# Patient Record
Sex: Female | Born: 1985 | Race: White | Hispanic: No | Marital: Married | Smoking: Former smoker
Health system: Southern US, Community
[De-identification: ages and names within clinical notes are randomized; demographics above are authoritative.]

## PROBLEM LIST (undated history)

## (undated) ENCOUNTER — Inpatient Hospital Stay (HOSPITAL_COMMUNITY): Payer: Self-pay

## (undated) DIAGNOSIS — O139 Gestational [pregnancy-induced] hypertension without significant proteinuria, unspecified trimester: Secondary | ICD-10-CM

## (undated) DIAGNOSIS — F32A Depression, unspecified: Secondary | ICD-10-CM

## (undated) DIAGNOSIS — R519 Headache, unspecified: Secondary | ICD-10-CM

## (undated) DIAGNOSIS — R51 Headache: Secondary | ICD-10-CM

## (undated) DIAGNOSIS — Z789 Other specified health status: Secondary | ICD-10-CM

## (undated) DIAGNOSIS — F329 Major depressive disorder, single episode, unspecified: Secondary | ICD-10-CM

## (undated) HISTORY — PX: NO PAST SURGERIES: SHX2092

---
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
## 2009-05-18 ENCOUNTER — Emergency Department (HOSPITAL_COMMUNITY): Admission: EM | Admit: 2009-05-18 | Discharge: 2009-05-19 | Payer: Self-pay | Admitting: Emergency Medicine

## 2010-03-14 ENCOUNTER — Other Ambulatory Visit (HOSPITAL_COMMUNITY)
Admission: RE | Admit: 2010-03-14 | Discharge: 2010-03-14 | Disposition: A | Discharge status: Home / Self Care | Payer: BC Managed Care – PPO | Source: Ambulatory Visit | Attending: Family Medicine | Admitting: Family Medicine

## 2010-03-14 ENCOUNTER — Other Ambulatory Visit: Payer: Self-pay | Admitting: Family Medicine

## 2010-03-14 DIAGNOSIS — Z124 Encounter for screening for malignant neoplasm of cervix: Secondary | ICD-10-CM | POA: Insufficient documentation

## 2011-10-08 ENCOUNTER — Other Ambulatory Visit: Payer: Self-pay | Admitting: Family Medicine

## 2011-10-08 ENCOUNTER — Other Ambulatory Visit (HOSPITAL_COMMUNITY)
Admission: RE | Admit: 2011-10-08 | Discharge: 2011-10-08 | Disposition: A | Discharge status: Home / Self Care | Payer: BC Managed Care – PPO | Source: Ambulatory Visit | Attending: Family Medicine | Admitting: Family Medicine

## 2011-10-08 DIAGNOSIS — Z124 Encounter for screening for malignant neoplasm of cervix: Secondary | ICD-10-CM | POA: Insufficient documentation

## 2011-10-08 DIAGNOSIS — Z1151 Encounter for screening for human papillomavirus (HPV): Secondary | ICD-10-CM | POA: Insufficient documentation

## 2015-07-31 ENCOUNTER — Other Ambulatory Visit: Payer: Self-pay | Admitting: Obstetrics and Gynecology

## 2015-07-31 LAB — OB RESULTS CONSOLE GC/CHLAMYDIA
CHLAMYDIA, DNA PROBE: NEGATIVE
Gonorrhea: NEGATIVE

## 2015-08-01 LAB — CYTOLOGY - PAP

## 2015-08-14 LAB — OB RESULTS CONSOLE HIV ANTIBODY (ROUTINE TESTING): HIV: NONREACTIVE

## 2015-08-14 LAB — OB RESULTS CONSOLE RUBELLA ANTIBODY, IGM: Rubella: IMMUNE

## 2015-08-14 LAB — OB RESULTS CONSOLE RPR: RPR: NONREACTIVE

## 2015-08-14 LAB — OB RESULTS CONSOLE HEPATITIS B SURFACE ANTIGEN: Hepatitis B Surface Ag: NEGATIVE

## 2015-12-13 ENCOUNTER — Inpatient Hospital Stay (HOSPITAL_COMMUNITY)
Admission: AD | Admit: 2015-12-13 | Discharge: 2015-12-15 | DRG: 781 | Disposition: A | Discharge status: Home / Self Care | Payer: Commercial Managed Care - PPO | Source: Ambulatory Visit | Attending: Obstetrics and Gynecology | Admitting: Obstetrics and Gynecology

## 2015-12-13 ENCOUNTER — Encounter (HOSPITAL_COMMUNITY): Payer: Self-pay

## 2015-12-13 DIAGNOSIS — O1493 Unspecified pre-eclampsia, third trimester: Secondary | ICD-10-CM

## 2015-12-13 DIAGNOSIS — Z8249 Family history of ischemic heart disease and other diseases of the circulatory system: Secondary | ICD-10-CM

## 2015-12-13 DIAGNOSIS — O1490 Unspecified pre-eclampsia, unspecified trimester: Secondary | ICD-10-CM | POA: Diagnosis present

## 2015-12-13 DIAGNOSIS — Z3A29 29 weeks gestation of pregnancy: Secondary | ICD-10-CM | POA: Diagnosis not present

## 2015-12-13 DIAGNOSIS — O1403 Mild to moderate pre-eclampsia, third trimester: Principal | ICD-10-CM | POA: Diagnosis present

## 2015-12-13 DIAGNOSIS — Z87891 Personal history of nicotine dependence: Secondary | ICD-10-CM | POA: Diagnosis not present

## 2015-12-13 DIAGNOSIS — Z888 Allergy status to other drugs, medicaments and biological substances status: Secondary | ICD-10-CM

## 2015-12-13 DIAGNOSIS — Z882 Allergy status to sulfonamides status: Secondary | ICD-10-CM | POA: Diagnosis not present

## 2015-12-13 HISTORY — DX: Major depressive disorder, single episode, unspecified: F32.9

## 2015-12-13 HISTORY — DX: Other specified health status: Z78.9

## 2015-12-13 HISTORY — DX: Gestational (pregnancy-induced) hypertension without significant proteinuria, unspecified trimester: O13.9

## 2015-12-13 HISTORY — DX: Depression, unspecified: F32.A

## 2015-12-13 LAB — URINALYSIS, ROUTINE W REFLEX MICROSCOPIC
BILIRUBIN URINE: NEGATIVE
GLUCOSE, UA: NEGATIVE mg/dL
Ketones, ur: NEGATIVE mg/dL
Nitrite: NEGATIVE
PROTEIN: NEGATIVE mg/dL
pH: 6.5 (ref 5.0–8.0)

## 2015-12-13 LAB — COMPREHENSIVE METABOLIC PANEL
ALK PHOS: 276 U/L — AB (ref 38–126)
ALT: 20 U/L (ref 14–54)
ANION GAP: 7 (ref 5–15)
AST: 22 U/L (ref 15–41)
Albumin: 2.8 g/dL — ABNORMAL LOW (ref 3.5–5.0)
BUN: 17 mg/dL (ref 6–20)
CALCIUM: 9 mg/dL (ref 8.9–10.3)
CO2: 22 mmol/L (ref 22–32)
CREATININE: 0.69 mg/dL (ref 0.44–1.00)
Chloride: 104 mmol/L (ref 101–111)
Glucose, Bld: 97 mg/dL (ref 65–99)
Potassium: 3.9 mmol/L (ref 3.5–5.1)
Sodium: 133 mmol/L — ABNORMAL LOW (ref 135–145)
Total Bilirubin: 0.7 mg/dL (ref 0.3–1.2)
Total Protein: 5.8 g/dL — ABNORMAL LOW (ref 6.5–8.1)

## 2015-12-13 LAB — TYPE AND SCREEN
ABO/RH(D): B POS
Antibody Screen: NEGATIVE

## 2015-12-13 LAB — PROTEIN / CREATININE RATIO, URINE
CREATININE, URINE: 28 mg/dL
PROTEIN CREATININE RATIO: 1.18 mg/mg{creat} — AB (ref 0.00–0.15)
TOTAL PROTEIN, URINE: 33 mg/dL

## 2015-12-13 LAB — URINE MICROSCOPIC-ADD ON

## 2015-12-13 LAB — CBC
HCT: 36 % (ref 36.0–46.0)
Hemoglobin: 12.6 g/dL (ref 12.0–15.0)
MCH: 30.7 pg (ref 26.0–34.0)
MCHC: 35 g/dL (ref 30.0–36.0)
MCV: 87.6 fL (ref 78.0–100.0)
PLATELETS: 207 10*3/uL (ref 150–400)
RBC: 4.11 MIL/uL (ref 3.87–5.11)
RDW: 12.7 % (ref 11.5–15.5)
WBC: 11.7 10*3/uL — AB (ref 4.0–10.5)

## 2015-12-13 LAB — ABO/RH: ABO/RH(D): B POS

## 2015-12-13 MED ORDER — CALCIUM CARBONATE ANTACID 500 MG PO CHEW: 2.0000 | CHEWABLE_TABLET | ORAL | Status: DC | PRN

## 2015-12-13 MED ORDER — HYDRALAZINE HCL 20 MG/ML IJ SOLN: 10.0000 mg | Freq: Once | INTRAMUSCULAR | Status: DC | PRN

## 2015-12-13 MED ORDER — LABETALOL HCL 5 MG/ML IV SOLN
20.0000 mg | INTRAVENOUS | Status: DC | PRN
Start: 2015-12-13 — End: 2015-12-15

## 2015-12-13 MED ORDER — ZOLPIDEM TARTRATE 5 MG PO TABS: 5.0000 mg | ORAL_TABLET | Freq: Every evening | ORAL | Status: DC | PRN

## 2015-12-13 MED ORDER — DOCUSATE SODIUM 100 MG PO CAPS
100.0000 mg | ORAL_CAPSULE | Freq: Every day | ORAL | Status: DC
  Filled 2015-12-13: qty 1

## 2015-12-13 MED ORDER — ACETAMINOPHEN 325 MG PO TABS
650.0000 mg | ORAL_TABLET | ORAL | Status: DC | PRN
  Administered 2015-12-14: 650 mg via ORAL
  Filled 2015-12-13: qty 2

## 2015-12-13 MED ORDER — BETAMETHASONE SOD PHOS & ACET 6 (3-3) MG/ML IJ SUSP
12.0000 mg | INTRAMUSCULAR | Status: AC
  Administered 2015-12-13 – 2015-12-14 (×2): 12 mg via INTRAMUSCULAR
  Filled 2015-12-13 (×2): qty 2

## 2015-12-13 MED ORDER — PRENATAL MULTIVITAMIN CH
1.0000 | ORAL_TABLET | Freq: Every day | ORAL | Status: DC
Start: 2015-12-13 — End: 2015-12-15
  Administered 2015-12-14: 1 via ORAL
  Filled 2015-12-13: qty 1

## 2015-12-13 NOTE — MAU Note (Signed)
Sent from the office for hypertension.

## 2015-12-13 NOTE — MAU Provider Note (Signed)
Chief Complaint:  Hypertension    HPI: Kayla Bryant is a 30 y.o. G1P0 at 63100w2d who presents to maternity admissions reporting elevated BPs[facility]  Patient states this week she has been feeling "crummy" with on/off headaches. She noted to have blurred vision yesterday, but it resolved after 1 hour. She also had a headache yesterday that resolved after rest. She has been taking her BPs at home, which have been elevated, but was told her BP cuff was not accurate when she brought it to the office to compare. In the office for the past couple of weeks she has had normal range BPs, but today when she went in, she had BPs in the 140-150/90-100. She has had edema of LE that has worsened this past week. Denies currently any HAs, changes in vision, RUQ/epigastric pain, CP/SOB.   Denies contractions, leakage of fluid or vaginal bleeding. Good fetal movement.    Past Medical History: No past medical history on file.  Past obstetric history: OB History  Gravida Para Term Preterm AB Living  1            SAB TAB Ectopic Multiple Live Births        0      # Outcome Date GA Lbr Len/2nd Weight Sex Delivery Anes PTL Lv  1 Current               Past Surgical History: No past surgical history on file.   Family History: No family history on file.  Social History: Social History  Substance Use Topics  . Smoking status: Not on file  . Smokeless tobacco: Not on file  . Alcohol use Not on file    Allergies:  Allergies  Allergen Reactions  . Lexapro [Escitalopram] Itching  . Sulfa Antibiotics Itching    Meds:  No prescriptions prior to admission.    I have reviewed patient's Past Medical Hx, Surgical Hx, Family Hx, Social Hx, medications and allergies.   ROS:  A comprehensive ROS was negative except per HPI.    Physical Exam  Patient Vitals for the past 24 hrs:  Weight  12/13/15 0902 227 lb 12.8 oz (103.3 kg)   Constitutional: Well-developed, well-nourished obese  female in no acute distress.  Cardiovascular: normal rate, rhythm, no murmurs Respiratory: normal effort, CTAB GI: Abd soft, non-tender, gravid appropriate for gestational age. Pos BS x 4 MS: Extremities nontender, +1 pitting edema bilateral LE, normal ROM Neurologic: Alert and oriented x 4.  GU: Neg CVAT.    FHT:  Baseline 140, moderate variability, accelerations present, no decelerations Contractions: None   Labs: Results for orders placed or performed during the hospital encounter of 12/13/15 (from the past 24 hour(s))  Urinalysis, Routine w reflex microscopic (not [facility])     Status: Abnormal   Collection Time: 12/13/15  8:57 AM  Result Value Ref Range   Color, Urine YELLOW YELLOW   APPearance HAZY (A) CLEAR   Specific Gravity, Urine <1.005 (L) 1.005 - 1.030   pH 6.5 5.0 - 8.0   Glucose, UA NEGATIVE NEGATIVE mg/dL   Hgb urine dipstick TRACE (A) NEGATIVE   Bilirubin Urine NEGATIVE NEGATIVE   Ketones, ur NEGATIVE NEGATIVE mg/dL   Protein, ur NEGATIVE NEGATIVE mg/dL   Nitrite NEGATIVE NEGATIVE   Leukocytes, UA SMALL (A) NEGATIVE  Protein / creatinine ratio, urine     Status: Abnormal   Collection Time: 12/13/15  8:57 AM  Result Value Ref Range   Creatinine, Urine  28.00 mg/dL   Total Protein, Urine 33 mg/dL   Protein Creatinine Ratio 1.18 (H) 0.00 - 0.15 mg/mg[Cre]  Urine microscopic-add on     Status: Abnormal   Collection Time: 12/13/15  8:57 AM  Result Value Ref Range   Squamous Epithelial / LPF 6-30 (A) NONE SEEN   WBC, UA 6-30 0 - 5 WBC/hpf   RBC / HPF 0-5 0 - 5 RBC/hpf   Bacteria, UA MANY (A) NONE SEEN  CBC     Status: Abnormal   Collection Time: 12/13/15  9:28 AM  Result Value Ref Range   WBC 11.7 (H) 4.0 - 10.5 K/uL   RBC 4.11 3.87 - 5.11 MIL/uL   Hemoglobin 12.6 12.0 - 15.0 g/dL   HCT 16.136.0 09.636.0 - 04.546.0 %   MCV 87.6 78.0 - 100.0 fL   MCH 30.7 26.0 - 34.0 pg   MCHC 35.0 30.0 - 36.0 g/dL   RDW 40.912.7 81.111.5 - 91.415.5 %   Platelets 207 150 - 400 K/uL    Imaging:   No results found.  MAU Course: PreE workup BPs  Spoke with Dr. Henderson CloudHorvath who agrees with preeclampsia, would like the patient admitted for observation and corticosteroids. Magnesium not indicated at this time.  MDM: Plan of care reviewed with patient, including labs and tests ordered and medical treatment.  I personally reviewed the patient's NST today, found to be REACTIVE. 140 bpm, mod var, +accels, no decels. CTX: None   Assessment: Preeclampsia without severe features  Plan: Admit to inpatient for 24 hours observation, Betamethasone, 24 hour urine protein collection    [facility] Woodland Baltasar Twilley, DO 12/13/2015 9:05 AM

## 2015-12-13 NOTE — MAU Provider Note (Signed)
Please refer to full NP note below.  Briefly, this is a 30 y.o. G1P0 2291w2d with probably early mild preeclampsia and headache yesterday, no other severe features.  BPs are now 140s-150s/90-100s and she has a pr-cr ratio of 1.8.  Her headache yesterday did resolve with rest.  All other labs were normal.    FHTs 140s, gSTV, NST R, occ mild variable.  Because of early gestation and early preeclampsia, will admit for obs for 24 hours, do 24 hour urine, BMZ administration, and repeat labs in am tomorrow.    Kayla Bryant A  NP note:  Chief Complaint:  Hypertension    HPI: Kayla Bryant is a 30 y.o. G1P0 at 2791w2d who presents to maternity admissions reporting elevated BPs[facility]  Patient states this week she has been feeling "crummy" with on/off headaches. She noted to have blurred vision yesterday, but it resolved after 1 hour. She also had a headache yesterday that resolved after rest. She has been taking her BPs at home, which have been elevated, but was told her BP cuff was not accurate when she brought it to the office to compare. In the office for the past couple of weeks she has had normal range BPs, but today when she went in, she had BPs in the 140-150/90-100. She has had edema of LE that has worsened this past week. Denies currently any HAs, changes in vision, RUQ/epigastric pain, CP/SOB.   Denies contractions, leakage of fluid or vaginal bleeding. Good fetal movement.    Past Medical History: Past Medical History:  Diagnosis Date  . Depression   . Medical history non-contributory   . Pregnancy induced hypertension     Past obstetric history: OB History  Gravida Para Term Preterm AB Living  1            SAB TAB Ectopic Multiple Live Births        0      # Outcome Date GA Lbr Len/2nd Weight Sex Delivery Anes PTL Lv  1 Current               Past Surgical History: Past Surgical History:  Procedure Laterality Date  . NO PAST SURGERIES       Family  History: Family History  Problem Relation Age of Onset  . Hypertension Brother   . Diabetes Paternal Uncle   . Diabetes Maternal Grandfather   . Cancer Paternal Grandmother     Social History: Social History  Substance Use Topics  . Smoking status: Former Smoker    Quit date: 02/11/2005  . Smokeless tobacco: Never Used  . Alcohol use No    Allergies:  Allergies  Allergen Reactions  . Lexapro [Escitalopram] Itching  . Sulfa Antibiotics Itching    Meds:  Prescriptions Prior to Admission  Medication Sig Dispense Refill Last Dose  . Prenatal Vit-Fe Fumarate-FA (PRENATAL MULTIVITAMIN) TABS tablet Take 1 tablet by mouth daily at 12 noon.   12/11/2015  . ranitidine (ZANTAC) 150 MG capsule Take 150 mg by mouth every evening.   12/12/2015 at Unknown time    I have reviewed patient's Past Medical Hx, Surgical Hx, Family Hx, Social Hx, medications and allergies.   ROS:  A comprehensive ROS was negative except per HPI.    Physical Exam   Patient Vitals for the past 24 hrs:  BP Temp Temp src Pulse Resp SpO2 Height Weight  12/13/15 1135 (!) 140/92 98.5 F (36.9 C) Oral 74 18 - 5\' 4"  (1.626  m) 103.3 kg (227 lb 12.8 oz)  12/13/15 1053 136/83 - - 67 - - - -  12/13/15 1050 - - - 80 - 97 % - -  12/13/15 1042 - - - 77 - 99 % - -  12/13/15 1037 - - - 66 - 96 % - -  12/13/15 1036 115/83 - - 75 - - - -  12/13/15 1032 - - - 81 - 96 % - -  12/13/15 1027 - - - 76 - 96 % - -  12/13/15 1020 134/84 - - - - - - -  12/13/15 0936 146/85 - - 86 - - - -  12/13/15 0920 147/93 - - 84 - - - -  12/13/15 0914 144/96 - - 80 - - - -  12/13/15 0910 154/94 98.4 F (36.9 C) Oral 80 16 - - -  12/13/15 1610 - - - - - - 5\' 4"  (1.626 m) -  12/13/15 0902 - - - - - - - 103.3 kg (227 lb 12.8 oz)   Constitutional: Well-developed, well-nourished obese female in no acute distress.  Cardiovascular: normal rate, rhythm, no murmurs Respiratory: normal effort, CTAB GI: Abd soft, non-tender, gravid appropriate for  gestational age. Pos BS x 4 MS: Extremities nontender, +1 pitting edema bilateral LE, normal ROM Neurologic: Alert and oriented x 4.  GU: Neg CVAT.    FHT:  Baseline 140, moderate variability, accelerations present, no decelerations Contractions: None   Labs: Results for orders placed or performed during the hospital encounter of 12/13/15 (from the past 24 hour(s))  Urinalysis, Routine w reflex microscopic (not at [facility])     Status: Abnormal   Collection Time: 12/13/15  8:57 AM  Result Value Ref Range   Color, Urine YELLOW YELLOW   APPearance HAZY (A) CLEAR   Specific Gravity, Urine <1.005 (L) 1.005 - 1.030   pH 6.5 5.0 - 8.0   Glucose, UA NEGATIVE NEGATIVE mg/dL   Hgb urine dipstick TRACE (A) NEGATIVE   Bilirubin Urine NEGATIVE NEGATIVE   Ketones, ur NEGATIVE NEGATIVE mg/dL   Protein, ur NEGATIVE NEGATIVE mg/dL   Nitrite NEGATIVE NEGATIVE   Leukocytes, UA SMALL (A) NEGATIVE  Protein / creatinine ratio, urine     Status: Abnormal   Collection Time: 12/13/15  8:57 AM  Result Value Ref Range   Creatinine, Urine 28.00 mg/dL   Total Protein, Urine 33 mg/dL   Protein Creatinine Ratio 1.18 (H) 0.00 - 0.15 mg/mg[Cre]  Urine microscopic-add on     Status: Abnormal   Collection Time: 12/13/15  8:57 AM  Result Value Ref Range   Squamous Epithelial / LPF 6-30 (A) NONE SEEN   WBC, UA 6-30 0 - 5 WBC/hpf   RBC / HPF 0-5 0 - 5 RBC/hpf   Bacteria, UA MANY (A) NONE SEEN  Comprehensive metabolic panel     Status: Abnormal   Collection Time: 12/13/15  9:28 AM  Result Value Ref Range   Sodium 133 (L) 135 - 145 mmol/L   Potassium 3.9 3.5 - 5.1 mmol/L   Chloride 104 101 - 111 mmol/L   CO2 22 22 - 32 mmol/L   Glucose, Bld 97 65 - 99 mg/dL   BUN 17 6 - 20 mg/dL   Creatinine, Ser 9.60 0.44 - 1.00 mg/dL   Calcium 9.0 8.9 - 45.4 mg/dL   Total Protein 5.8 (L) 6.5 - 8.1 g/dL   Albumin 2.8 (L) 3.5 - 5.0 g/dL   AST 22 15 - 41 U/L  ALT 20 14 - 54 U/L   Alkaline Phosphatase 276 (H) 38 - 126 U/L    Total Bilirubin 0.7 0.3 - 1.2 mg/dL   GFR calc non Af Amer >60 >60 mL/min   GFR calc Af Amer >60 >60 mL/min   Anion gap 7 5 - 15  CBC     Status: Abnormal   Collection Time: 12/13/15  9:28 AM  Result Value Ref Range   WBC 11.7 (H) 4.0 - 10.5 K/uL   RBC 4.11 3.87 - 5.11 MIL/uL   Hemoglobin 12.6 12.0 - 15.0 g/dL   HCT 16.136.0 09.636.0 - 04.546.0 %   MCV 87.6 78.0 - 100.0 fL   MCH 30.7 26.0 - 34.0 pg   MCHC 35.0 30.0 - 36.0 g/dL   RDW 40.912.7 81.111.5 - 91.415.5 %   Platelets 207 150 - 400 K/uL  Type and screen [facility]     Status: None   Collection Time: 12/13/15  9:28 AM  Result Value Ref Range   ABO/RH(D) B POS    Antibody Screen NEG    Sample Expiration 12/16/2015     Imaging:  No results found.  MAU Course: PreE workup BPs  Spoke with Dr. Henderson CloudHorvath who agrees with preeclampsia, would like the patient admitted for observation and corticosteroids. Magnesium not indicated at this time.  MDM: Plan of care reviewed with patient, including labs and tests ordered and medical treatment.  I personally reviewed the patient's NST today, found to be REACTIVE. 140 bpm, mod var, +accels, no decels. CTX: None   Assessment: Preeclampsia without severe features  Plan: Admit to inpatient for 24 hours observation, Betamethasone, 24 hour urine protein collection    Carrington ClampMichelle Shawne Bulow, MD 12/13/2015 2:41 PM

## 2015-12-14 LAB — PROTEIN, URINE, 24 HOUR
Collection Interval-UPROT: 24 hours
PROTEIN, 24H URINE: 2940 mg/d — AB (ref 50–100)
PROTEIN, URINE: 147 mg/dL
Urine Total Volume-UPROT: 2000 mL

## 2015-12-14 LAB — CBC
HEMATOCRIT: 37.8 % (ref 36.0–46.0)
Hemoglobin: 13.2 g/dL (ref 12.0–15.0)
MCH: 30.8 pg (ref 26.0–34.0)
MCHC: 34.9 g/dL (ref 30.0–36.0)
MCV: 88.1 fL (ref 78.0–100.0)
Platelets: 259 10*3/uL (ref 150–400)
RBC: 4.29 MIL/uL (ref 3.87–5.11)
RDW: 12.9 % (ref 11.5–15.5)
WBC: 22.1 10*3/uL — AB (ref 4.0–10.5)

## 2015-12-14 LAB — COMPREHENSIVE METABOLIC PANEL
ALT: 22 U/L (ref 14–54)
AST: 24 U/L (ref 15–41)
Albumin: 3.1 g/dL — ABNORMAL LOW (ref 3.5–5.0)
Alkaline Phosphatase: 301 U/L — ABNORMAL HIGH (ref 38–126)
Anion gap: 9 (ref 5–15)
BUN: 17 mg/dL (ref 6–20)
CHLORIDE: 105 mmol/L (ref 101–111)
CO2: 20 mmol/L — ABNORMAL LOW (ref 22–32)
Calcium: 9.1 mg/dL (ref 8.9–10.3)
Creatinine, Ser: 0.67 mg/dL (ref 0.44–1.00)
Glucose, Bld: 107 mg/dL — ABNORMAL HIGH (ref 65–99)
POTASSIUM: 4.3 mmol/L (ref 3.5–5.1)
Sodium: 134 mmol/L — ABNORMAL LOW (ref 135–145)
Total Bilirubin: 0.8 mg/dL (ref 0.3–1.2)
Total Protein: 7.1 g/dL (ref 6.5–8.1)

## 2015-12-14 MED ORDER — CEFUROXIME AXETIL 250 MG PO TABS
250.0000 mg | ORAL_TABLET | Freq: Two times a day (BID) | ORAL | Status: DC
  Administered 2015-12-15: 250 mg via ORAL
  Filled 2015-12-14 (×3): qty 1

## 2015-12-14 NOTE — Discharge Summary (Signed)
Physician Discharge Summary  Patient ID: Leonides GrillsStephanie L Schriever MRN: [id number] DOB/AGE: [birth year] 30 y.o.  Admit date: 12/13/2015 Discharge date: 12/14/2015  Admission Diagnoses:preterm mild preeclampsia  Discharge Diagnoses:  Active Problems:   Preeclampsia   Discharged Condition: good  Hospital Course: Uncomplicated obs for severe symptoms- none seen.  Pt received 2 doses of BMZ and 24 hour urine done.  PIH labs were normal.   Consults: None  Significant Diagnostic Studies: labs:  Results for orders placed or performed during the hospital encounter of 12/13/15 (from the past 24 hour(s))  Culture, beta strep (group b only)     Status: None (Preliminary result)   Collection Time: 12/13/15 11:15 AM  Result Value Ref Range   Specimen Description VAGINAL/RECTAL    Special Requests NONE    Culture      CULTURE REINCUBATED FOR BETTER GROWTH [facility]    Report Status PENDING   Comprehensive metabolic panel     Status: Abnormal   Collection Time: 12/14/15  6:33 AM  Result Value Ref Range   Sodium 134 (L) 135 - 145 mmol/L   Potassium 4.3 3.5 - 5.1 mmol/L   Chloride 105 101 - 111 mmol/L   CO2 20 (L) 22 - 32 mmol/L   Glucose, Bld 107 (H) 65 - 99 mg/dL   BUN 17 6 - 20 mg/dL   Creatinine, Ser 4.090.67 0.44 - 1.00 mg/dL   Calcium 9.1 8.9 - 81.110.3 mg/dL   Total Protein 7.1 6.5 - 8.1 g/dL   Albumin 3.1 (L) 3.5 - 5.0 g/dL   AST 24 15 - 41 U/L   ALT 22 14 - 54 U/L   Alkaline Phosphatase 301 (H) 38 - 126 U/L   Total Bilirubin 0.8 0.3 - 1.2 mg/dL   GFR calc non Af Amer >60 >60 mL/min   GFR calc Af Amer >60 >60 mL/min   Anion gap 9 5 - 15  CBC     Status: Abnormal   Collection Time: 12/14/15  6:33 AM  Result Value Ref Range   WBC 22.1 (H) 4.0 - 10.5 K/uL   RBC 4.29 3.87 - 5.11 MIL/uL   Hemoglobin 13.2 12.0 - 15.0 g/dL   HCT 91.437.8 78.236.0 - 95.646.0 %   MCV 88.1 78.0 - 100.0 fL   MCH 30.8 26.0 - 34.0 pg   MCHC 34.9 30.0 - 36.0 g/dL   RDW 21.312.9 08.611.5 - 57.815.5 %   Platelets  259 150 - 400 K/uL    Treatments: BMZ x 2  Discharge Exam: Blood pressure (!) 144/88, pulse 89, temperature 98.5 F (36.9 C), temperature source Oral, resp. rate 16, height 5\' 4"  (1.626 m), weight 103.3 kg (227 lb 12.8 oz), SpO2 99 %, unknown if currently breastfeeding.   Disposition: Final discharge disposition not confirmed  Discharge Instructions    Discharge activity:  Up to eat    Complete by:  As directed    Discharge diet:  No restrictions    Complete by:  As directed    Discharge instructions    Complete by:  As directed    Modified bedrest.  Refrain from intercourse.  Count baby's movements in 1 hour per day- if you don't get 6 in that hour, call.   Do not have sex or do anything that might make you have an orgasm    Complete by:  As directed    Fetal Kick Count:  Lie on our left side for one hour after a meal, and count  the number of times your baby kicks.  If it is less than 5 times, get up, move around and drink some juice.  Repeat the test 30 minutes later.  If it is still less than 5 kicks in an hour, notify your doctor.    Complete by:  As directed    LABOR:  When conractions begin, you should start to time them from the beginning of one contraction to the beginning  of the next.  When contractions are 5 - 10 minutes apart or less and have been regular for at least an hour, you should call your health care provider.    Complete by:  As directed    Notify physician for bleeding from the vagina    Complete by:  As directed    Notify physician for blurring of vision or spots before the eyes    Complete by:  As directed    Notify physician for chills or fever    Complete by:  As directed    Notify physician for fainting spells, "black outs" or loss of consciousness    Complete by:  As directed    Notify physician for increase in vaginal discharge    Complete by:  As directed    Notify physician for leaking of fluid    Complete by:  As directed    Notify physician for  pain or burning when urinating    Complete by:  As directed    Notify physician for pelvic pressure (sudden increase)    Complete by:  As directed    Notify physician for severe or continued nausea or vomiting    Complete by:  As directed    Notify physician for sudden gushing of fluid from the vagina (with or without continued leaking)    Complete by:  As directed    Notify physician for sudden, constant, or occasional abdominal pain    Complete by:  As directed    Notify physician if baby moving less than usual    Complete by:  As directed        Medication List    TAKE these medications   prenatal multivitamin Tabs tablet Take 1 tablet by mouth daily at 12 noon.   ranitidine 150 MG capsule Commonly known as:  ZANTAC Take 150 mg by mouth every evening.        Signed: Raider Valbuena A 12/14/2015, 10:56 AM

## 2015-12-14 NOTE — Progress Notes (Addendum)
30 y.o. G1P0 8715w3d HD#1 admitted for 29WKS,BP.  Pt now with diagnosis of preeclampsia with proteinuria by PR-CR clearance.  Pt stable over 24 hour obs.  HA this am was resolved quickly with tylenol.   Pt currently stable with no c/o severe preeclampsia symptoms.  Good FM.  Vitals:   12/13/15 2204 12/14/15 0614 12/14/15 0621 12/14/15 0736  BP:  (!) 164/97 (!) 148/94 (!) 144/88  Pulse:  88 91 89  Resp: 18 18 18 16   Temp:  98.5 F (36.9 C)  98.5 F (36.9 C)  TempSrc:  Oral  Oral  SpO2:      Weight:      Height:        Lungs CTA Cor RRR Abd  Soft, gravid, nontender Ex SCDs, +1 edema FHTs  140ss, good short term variability, NST R Toco  occ  Results for orders placed or performed during the hospital encounter of 12/13/15 (from the past 24 hour(s))  Culture, beta strep (group b only)     Status: None (Preliminary result)   Collection Time: 12/13/15 11:15 AM  Result Value Ref Range   Specimen Description VAGINAL/RECTAL    Special Requests NONE    Culture      CULTURE REINCUBATED FOR BETTER GROWTH [facility]formed [facility]    Report Status PENDING   Comprehensive metabolic panel     Status: Abnormal   Collection Time: 12/14/15  6:33 AM  Result Value Ref Range   Sodium 134 (L) 135 - 145 mmol/L   Potassium 4.3 3.5 - 5.1 mmol/L   Chloride 105 101 - 111 mmol/L   CO2 20 (L) 22 - 32 mmol/L   Glucose, Bld 107 (H) 65 - 99 mg/dL   BUN 17 6 - 20 mg/dL   Creatinine, Ser 0.860.67 0.44 - 1.00 mg/dL   Calcium 9.1 8.9 - 57.810.3 mg/dL   Total Protein 7.1 6.5 - 8.1 g/dL   Albumin 3.1 (L) 3.5 - 5.0 g/dL   AST 24 15 - 41 U/L   ALT 22 14 - 54 U/L   Alkaline Phosphatase 301 (H) 38 - 126 U/L   Total Bilirubin 0.8 0.3 - 1.2 mg/dL   GFR calc non Af Amer >60 >60 mL/min   GFR calc Af Amer >60 >60 mL/min   Anion gap 9 5 - 15  CBC     Status: Abnormal   Collection Time: 12/14/15  6:33 AM  Result Value Ref Range   WBC 22.1 (H) 4.0 - 10.5 K/uL   RBC 4.29 3.87 - 5.11 MIL/uL   Hemoglobin 13.2  12.0 - 15.0 g/dL   HCT 46.937.8 62.936.0 - 52.846.0 %   MCV 88.1 78.0 - 100.0 fL   MCH 30.8 26.0 - 34.0 pg   MCHC 34.9 30.0 - 36.0 g/dL   RDW 41.312.9 24.411.5 - 01.015.5 %   Platelets 259 150 - 400 K/uL    A:  HD#1  7215w3d with early mild preeclampsia.  BPs occasionally near severe level but otherwise labs are normal and pt is without symptoms.  P: Continue obs until 24 hours and 24 hour urine is up; will watch BPS and consider keeping if BPs are hitting close to severe range.  Pt is due for second dose of BMZ later.  Pt given precautions if goes home; pt to be seen biweekly at office for testing once a week and BP check other part of week.    Antonela Freiman A

## 2015-12-14 NOTE — Progress Notes (Signed)
24 hour urine came back 2.94 grams of protein.  BPs still occ hitting severe range.    Pt moved to full admission.  Will treat UTI (6-30 WBC on UA), get MFM US for growth and have MFM consult on pt.

## 2015-12-15 ENCOUNTER — Inpatient Hospital Stay (HOSPITAL_COMMUNITY): Payer: Commercial Managed Care - PPO

## 2015-12-15 LAB — CULTURE, BETA STREP (GROUP B ONLY)

## 2015-12-15 NOTE — Progress Notes (Signed)
Discharge instructions reviewed with patient. Verbalized an understanding of discharge instructions. Discharged ambulatory.

## 2015-12-15 NOTE — Progress Notes (Signed)
Name: Leonides GrillsStephanie L Centrella Medical Record Number:  [id number] Date of Birth: October 21, 1985 Date of Service: 12/15/2015  30 y.o. G1P0 5821w4d HD#2 admitted for elevated BPs and concern for preeclampsia with severe features.   Pt currently stable with No complaints. She denies HA, no blurry vision, no scotomata, no RUQ pain.  She denies contractions, no vaginal bleeding, no leaking of fluid. Reports good FM.  The patient's past medical history and prenatal records were reviewed.  Additional issues addressed and updated today: Patient Active Problem List   Diagnosis Date Noted  . Preeclampsia 12/13/2015    Physical Examination:   Vitals:   12/14/15 2345 12/15/15 0805  BP: (!) 146/95 (!) 144/87  Pulse: 78 85  Resp: 18 16  Temp: 98.3 F (36.8 C) 98 F (36.7 C)   General appearance - alert, well appearing, and in no distress and oriented to person, place, and time Mental status - alert, oriented to person, place, and time, normal mood, behavior, speech, dress, motor activity, and thought processes  Abd  Soft, gravid, nontender Ex SCDs FHTs  145s, moderate variability accels no decels Toco  none NEURO: 2+ DTRs Cervix: not evaluated  No results found for this or any previous visit (from the past 24 hour(s)).  A:  HD#2  321w4d with preeclampsia w/o severe features. Steroid complete  P: Discussed case with Dr. Marjo Bickerenney, MFM: -Patient can be discharged today with close outpatient follow up -Growth scan today then q3 weeks -Weekly labs -Weekly Antepartum fetal testing including AFI then once 32 weeks twice weekly NSTs -Delivery once 36-37 weeks.   Mitul Hallowell STACIA

## 2015-12-15 NOTE — Discharge Instructions (Signed)
Hypertension During Pregnancy °Hypertension is also called high blood pressure. Blood pressure moves blood in your body. Sometimes, the force that moves the blood becomes too strong. When you are pregnant, this condition should be watched carefully. It can cause problems for you and your baby. °HOME CARE  °· Make and keep all of your doctor visits. °· Take medicine as told by your doctor. Tell your doctor about all medicines you take. °· Eat very little salt. °· Exercise regularly. °· Do not drink alcohol. °· Do not smoke. °· Do not have drinks with caffeine. °· Lie on your left side when resting. °· Your health care provider may ask you to take one low-dose aspirin (81mg) each day. °GET HELP RIGHT AWAY IF: °· You have bad belly (abdominal) pain. °· You have sudden puffiness (swelling) in the hands, ankles, or face. °· You gain 4 pounds (1.8 kilograms) or more in 1 week. °· You throw up (vomit) repeatedly. °· You have bleeding from the vagina. °· You do not feel the baby moving as much. °· You have a headache. °· You have blurred or double vision. °· You have muscle twitching or spasms. °· You have shortness of breath. °· You have blue fingernails and lips. °· You have blood in your pee (urine). °MAKE SURE YOU: °· Understand these instructions. °· Will watch your condition. °· Will get help right away if you are not doing well or get worse. °  °This information is not intended to replace advice given to you by your health care provider. Make sure you discuss any questions you have with your health care provider. °  °Document Released: 02/28/2010 Document Revised: 02/16/2014 Document Reviewed: 08/25/2012 °Elsevier Interactive Patient Education ©2016 [employer] ° °

## 2015-12-17 NOTE — H&P (Signed)
[name], MD  Obstetrics and Gynecology  Expand All Collapse All   [] Hide copied text Please refer to full NP note below.  Briefly, this is a 30 y.o. G1P0 5856w2d with probably early mild preeclampsia and headache yesterday, no other severe features.  BPs are now 140s-150s/90-100s and she has a pr-cr ratio of 1.8.  Her headache yesterday did resolve with rest.  All other labs were normal.    FHTs 140s, gSTV, NST R, occ mild variable.  Because of early gestation and early preeclampsia, will admit for obs for 24 hours, do 24 hour urine, BMZ administration, and repeat labs in am tomorrow.    Darlisa Spruiell A  NP note:  Chief Complaint:  Hypertension    HPI: Leonides GrillsStephanie L Bryant is a 30 y.o. G1P0 at 7056w2d who presents to maternity admissions reporting elevated BPs, [facility]  Patient states this week she has been feeling "crummy" with on/off headaches. She noted to have blurred vision yesterday, but it resolved after 1 hour. She also had a headache yesterday that resolved after rest. She has been taking her BPs at home, which have been elevated, but was told her BP cuff was not accurate when she brought it to the office to compare. In the office for the past couple of weeks she has had normal range BPs, but today when she went in, she had BPs in the 140-150/90-100. She has had edema of LE that has worsened this past week. Denies currently any HAs, changes in vision, RUQ/epigastric pain, CP/SOB.   Denies contractions, leakage of fluid or vaginal bleeding. Good fetal movement.    Past Medical History:     Past Medical History:  Diagnosis Date  . Depression   . Medical history non-contributory   . Pregnancy induced hypertension     Past obstetric history:                 OB History  Gravida Para Term Preterm AB Living  1            SAB TAB Ectopic Multiple Live Births        0      # Outcome Date GA Lbr Len/2nd Weight Sex Delivery Anes PTL Lv  1  Current               Past Surgical History:      Past Surgical History:  Procedure Laterality Date  . NO PAST SURGERIES       Family History:      Family History  Problem Relation Age of Onset  . Hypertension Brother   . Diabetes Paternal Uncle   . Diabetes Maternal Grandfather   . Cancer Paternal Grandmother     Social History:      Social History  Substance Use Topics  . Smoking status: Former Smoker    Quit date: 02/11/2005  . Smokeless tobacco: Never Used  . Alcohol use No    Allergies:      Allergies  Allergen Reactions  . Lexapro [Escitalopram] Itching  . Sulfa Antibiotics Itching    Meds:         Prescriptions Prior to Admission  Medication Sig Dispense Refill Last Dose  . Prenatal Vit-Fe Fumarate-FA (PRENATAL MULTIVITAMIN) TABS tablet Take 1 tablet by mouth daily at 12 noon.   12/11/2015  . ranitidine (ZANTAC) 150 MG capsule Take 150 mg by mouth every evening.   12/12/2015 at Unknown time    I have reviewed patient's Past  Medical Hx, Surgical Hx, Family Hx, Social Hx, medications and allergies.   ROS:  A comprehensive ROS was negative except per HPI.    Physical Exam   Patient Vitals for the past 24 hrs:  BP Temp Temp src Pulse Resp SpO2 Height Weight  12/13/15 1135 (!) 140/92 98.5 F (36.9 C) Oral 74 18 - 5\' 4"  (1.626 m) 103.3 kg (227 lb 12.8 oz)  12/13/15 1053 136/83 - - 67 - - - -  12/13/15 1050 - - - 80 - 97 % - -  12/13/15 1042 - - - 77 - 99 % - -  12/13/15 1037 - - - 66 - 96 % - -  12/13/15 1036 115/83 - - 75 - - - -  12/13/15 1032 - - - 81 - 96 % - -  12/13/15 1027 - - - 76 - 96 % - -  12/13/15 1020 134/84 - - - - - - -  12/13/15 0936 146/85 - - 86 - - - -  12/13/15 0920 147/93 - - 84 - - - -  12/13/15 0914 144/96 - - 80 - - - -  12/13/15 0910 154/94 98.4 F (36.9 C) Oral 80 16 - - -  12/13/15 [id number] - - - - - - 5\' 4"  (1.626 m) -  12/13/15 0902 - - - - - - - 103.3 kg (227 lb 12.8 oz)    Constitutional: Well-developed, well-nourished obese female in no acute distress.  Cardiovascular: normal rate, rhythm, no murmurs Respiratory: normal effort, CTAB GI: Abd soft, non-tender, gravid appropriate for gestational age. Pos BS x 4 MS: Extremities nontender, +1 pitting edema bilateral LE, normal ROM Neurologic: Alert and oriented x 4.  GU: Neg CVAT.               FHT:  Baseline 140, moderate variability, accelerations present, no decelerations Contractions: None   Labs: LabResultsLast24Hours       Results for orders placed or performed during the hospital encounter of 12/13/15 (from the past 24 hour(s))  Urinalysis, Routine w reflex microscopic ([facility])     Status: Abnormal   Collection Time: 12/13/15  8:57 AM  Result Value Ref Range   Color, Urine YELLOW YELLOW   APPearance HAZY (A) CLEAR   Specific Gravity, Urine <1.005 (L) 1.005 - 1.030   pH 6.5 5.0 - 8.0   Glucose, UA NEGATIVE NEGATIVE mg/dL   Hgb urine dipstick TRACE (A) NEGATIVE   Bilirubin Urine NEGATIVE NEGATIVE   Ketones, ur NEGATIVE NEGATIVE mg/dL   Protein, ur NEGATIVE NEGATIVE mg/dL   Nitrite NEGATIVE NEGATIVE   Leukocytes, UA SMALL (A) NEGATIVE  Protein / creatinine ratio, urine     Status: Abnormal   Collection Time: 12/13/15  8:57 AM  Result Value Ref Range   Creatinine, Urine 28.00 mg/dL   Total Protein, Urine 33 mg/dL   Protein Creatinine Ratio 1.18 (H) 0.00 - 0.15 mg/mg[Cre]  Urine microscopic-add on     Status: Abnormal   Collection Time: 12/13/15  8:57 AM  Result Value Ref Range   Squamous Epithelial / LPF 6-30 (A) NONE SEEN   WBC, UA 6-30 0 - 5 WBC/hpf   RBC / HPF 0-5 0 - 5 RBC/hpf   Bacteria, UA MANY (A) NONE SEEN  Comprehensive metabolic panel     Status: Abnormal   Collection Time: 12/13/15  9:28 AM  Result Value Ref Range   Sodium 133 (L) 135 - 145 mmol/L   Potassium 3.9 3.5 -  5.1 mmol/L   Chloride 104 101 - 111 mmol/L   CO2 22 22 - 32 mmol/L    Glucose, Bld 97 65 - 99 mg/dL   BUN 17 6 - 20 mg/dL   Creatinine, Ser 1.61 0.44 - 1.00 mg/dL   Calcium 9.0 8.9 - 09.6 mg/dL   Total Protein 5.8 (L) 6.5 - 8.1 g/dL   Albumin 2.8 (L) 3.5 - 5.0 g/dL   AST 22 15 - 41 U/L   ALT 20 14 - 54 U/L   Alkaline Phosphatase 276 (H) 38 - 126 U/L   Total Bilirubin 0.7 0.3 - 1.2 mg/dL   GFR calc non Af Amer >60 >60 mL/min   GFR calc Af Amer >60 >60 mL/min   Anion gap 7 5 - 15  CBC     Status: Abnormal   Collection Time: 12/13/15  9:28 AM  Result Value Ref Range   WBC 11.7 (H) 4.0 - 10.5 K/uL   RBC 4.11 3.87 - 5.11 MIL/uL   Hemoglobin 12.6 12.0 - 15.0 g/dL   HCT 04.5 40.9 - 81.1 %   MCV 87.6 78.0 - 100.0 fL   MCH 30.7 26.0 - 34.0 pg   MCHC 35.0 30.0 - 36.0 g/dL   RDW 91.4 78.2 - 95.6 %   Platelets 207 150 - 400 K/uL  Type and screen [facility] [place] [facility]     Status: None   Collection Time: 12/13/15  9:28 AM  Result Value Ref Range   ABO/RH(D) B POS    Antibody Screen NEG    Sample Expiration 12/16/2015       Imaging:  ImagingResults  No results found.    MAU Course: PreE workup BPs  Spoke with Dr. Henderson Cloud who agrees with preeclampsia, would like the patient admitted for observation and corticosteroids. Magnesium not indicated at this time.  MDM: Plan of care reviewed with patient, including labs and tests ordered and medical treatment.  I personally reviewed the patient's NST today, found to be REACTIVE. 140 bpm, mod var, +accels, no decels. CTX: None   Assessment: Preeclampsia without severe features  Plan: Admit to inpatient for 24 hours observation, Betamethasone, 24 hour urine protein collection    Carrington Clamp, MD 12/13/2015 2:41 PM      Electronically signed by Carrington Clamp, MD at 12/13/2015 2:50 PM      Admission (Discharged) on 12/13/2015        Detailed Report

## 2015-12-25 ENCOUNTER — Other Ambulatory Visit: Payer: Self-pay | Admitting: Obstetrics and Gynecology

## 2015-12-31 ENCOUNTER — Encounter (HOSPITAL_COMMUNITY): Payer: Self-pay | Admitting: *Deleted

## 2015-12-31 ENCOUNTER — Inpatient Hospital Stay (HOSPITAL_COMMUNITY)
Admission: AD | Admit: 2015-12-31 | Discharge: 2015-12-31 | Disposition: A | Discharge status: Home / Self Care | Payer: Commercial Managed Care - PPO | Source: Ambulatory Visit | Attending: Obstetrics | Admitting: Obstetrics

## 2015-12-31 DIAGNOSIS — I10 Essential (primary) hypertension: Secondary | ICD-10-CM | POA: Diagnosis not present

## 2015-12-31 DIAGNOSIS — Z3A31 31 weeks gestation of pregnancy: Secondary | ICD-10-CM | POA: Insufficient documentation

## 2015-12-31 DIAGNOSIS — O163 Unspecified maternal hypertension, third trimester: Secondary | ICD-10-CM | POA: Insufficient documentation

## 2015-12-31 DIAGNOSIS — O1493 Unspecified pre-eclampsia, third trimester: Secondary | ICD-10-CM | POA: Diagnosis not present

## 2015-12-31 DIAGNOSIS — F329 Major depressive disorder, single episode, unspecified: Secondary | ICD-10-CM | POA: Insufficient documentation

## 2015-12-31 DIAGNOSIS — Z87891 Personal history of nicotine dependence: Secondary | ICD-10-CM | POA: Diagnosis not present

## 2015-12-31 DIAGNOSIS — O99343 Other mental disorders complicating pregnancy, third trimester: Secondary | ICD-10-CM | POA: Diagnosis not present

## 2015-12-31 DIAGNOSIS — R030 Elevated blood-pressure reading, without diagnosis of hypertension: Secondary | ICD-10-CM | POA: Diagnosis present

## 2015-12-31 DIAGNOSIS — Z3689 Encounter for other specified antenatal screening: Secondary | ICD-10-CM

## 2015-12-31 HISTORY — DX: Headache, unspecified: R51.9

## 2015-12-31 HISTORY — DX: Headache: R51

## 2015-12-31 LAB — PROTEIN / CREATININE RATIO, URINE
Creatinine, Urine: 37 mg/dL
Protein Creatinine Ratio: 5.78 mg/mg{Cre} — ABNORMAL HIGH (ref 0.00–0.15)
Total Protein, Urine: 214 mg/dL

## 2015-12-31 LAB — COMPREHENSIVE METABOLIC PANEL
ALBUMIN: 2.4 g/dL — AB (ref 3.5–5.0)
ALK PHOS: 429 U/L — AB (ref 38–126)
ALT: 15 U/L (ref 14–54)
ANION GAP: 9 (ref 5–15)
AST: 42 U/L — ABNORMAL HIGH (ref 15–41)
BILIRUBIN TOTAL: 1.2 mg/dL (ref 0.3–1.2)
BUN: 17 mg/dL (ref 6–20)
CALCIUM: 8.5 mg/dL — AB (ref 8.9–10.3)
CO2: 20 mmol/L — ABNORMAL LOW (ref 22–32)
CREATININE: 0.88 mg/dL (ref 0.44–1.00)
Chloride: 104 mmol/L (ref 101–111)
GFR calc non Af Amer: >60 mL/min (ref >60)
GLUCOSE: 75 mg/dL (ref 65–99)
Potassium: 5.5 mmol/L — ABNORMAL HIGH (ref 3.5–5.1)
Sodium: 133 mmol/L — ABNORMAL LOW (ref 135–145)
TOTAL PROTEIN: 5.4 g/dL — AB (ref 6.5–8.1)

## 2015-12-31 LAB — CBC
HEMATOCRIT: 39.2 % (ref 36.0–46.0)
HEMOGLOBIN: 13.7 g/dL (ref 12.0–15.0)
MCH: 31.1 pg (ref 26.0–34.0)
MCHC: 34.9 g/dL (ref 30.0–36.0)
MCV: 88.9 fL (ref 78.0–100.0)
Platelets: 212 10*3/uL (ref 150–400)
RBC: 4.41 MIL/uL (ref 3.87–5.11)
RDW: 13.3 % (ref 11.5–15.5)
WBC: 12.2 10*3/uL — ABNORMAL HIGH (ref 4.0–10.5)

## 2015-12-31 MED ORDER — LABETALOL HCL 5 MG/ML IV SOLN
20.0000 mg | INTRAVENOUS | Status: DC | PRN
  Administered 2015-12-31: 20 mg via INTRAVENOUS
  Filled 2015-12-31: qty 4

## 2015-12-31 MED ORDER — HYDRALAZINE HCL 20 MG/ML IJ SOLN: 10.0000 mg | Freq: Once | INTRAMUSCULAR | Status: DC | PRN

## 2015-12-31 NOTE — MAU Note (Signed)
Sent from OB's office for further evaluation of BP:

## 2015-12-31 NOTE — MAU Provider Note (Signed)
History     CSN: [id number]  Arrival date and time: 12/31/15 1119   None     Chief Complaint  Patient presents with  . Hypertension   G4P0030 @31 .6 weeks sent from office for elevated BP. She denies HA, visual disturbances, and epigastric pain. She reports good FM. She denies VB, LOF, and ctx. She denies SOB and CP. She was admitted about 2 weeks ago for preeclampsia and has been followed in office twice weekly since.    OB History    Gravida Para Term Preterm AB Living   1             SAB TAB Ectopic Multiple Live Births         0        Past Medical History:  Diagnosis Date  . Depression   . Headache   . Medical history non-contributory   . Pregnancy induced hypertension     Past Surgical History:  Procedure Laterality Date  . NO PAST SURGERIES      Family History  Problem Relation Age of Onset  . Hypertension Brother   . Diabetes Paternal Uncle   . Diabetes Maternal Grandfather   . Cancer Paternal Grandmother     Social History  Substance Use Topics  . Smoking status: Former Smoker    Quit date: 02/11/2005  . Smokeless tobacco: Former NeurosurgeonUser  . Alcohol use No    Allergies:  Allergies  Allergen Reactions  . Lexapro [Escitalopram] Itching  . Sulfa Antibiotics Itching    Prescriptions Prior to Admission  Medication Sig Dispense Refill Last Dose  . Prenatal Vit-Fe Fumarate-FA (PRENATAL MULTIVITAMIN) TABS tablet Take 1 tablet by mouth daily at 12 noon.   12/11/2015  . ranitidine (ZANTAC) 150 MG capsule Take 150 mg by mouth every evening.   12/12/2015 at Unknown time    Review of Systems  Constitutional: Negative.   Respiratory: Negative.   Cardiovascular: Negative.   Gastrointestinal: Negative.    Physical Exam   Blood pressure (!) 133/102, pulse 88, unknown if currently breastfeeding. Vitals:   12/31/15 1322 12/31/15 1333 12/31/15 1348 12/31/15 1403  BP: 136/93 130/84 131/98 121/88  Pulse: 88 89 88 82    Physical Exam  Constitutional: She  is oriented to person, place, and time. She appears well-developed and well-nourished. No distress.  HENT:  Head: Normocephalic and atraumatic.  Neck: Normal range of motion. Neck supple.  Cardiovascular: Normal rate and regular rhythm.   Respiratory: Effort normal and breath sounds normal.  GI: Soft. She exhibits no distension. There is no tenderness.  gravid  Musculoskeletal: Normal range of motion.  Neurological: She is alert and oriented to person, place, and time. She displays abnormal reflex (brisk).  No clonus  Skin: Skin is warm and dry.  Psychiatric: She has a normal mood and affect.   EFM: 140 bpm, mod variability, + accels, no decels Toco: irritability  Results for orders placed or performed during the hospital encounter of 12/31/15 (from the past 24 hour(s))  Protein / creatinine ratio, urine     Status: Abnormal   Collection Time: 12/31/15 11:22 AM  Result Value Ref Range   Creatinine, Urine 37.00 mg/dL   Total Protein, Urine 214 mg/dL   Protein Creatinine Ratio 5.78 (H) 0.00 - 0.15 mg/mg[Cre]  CBC     Status: Abnormal   Collection Time: 12/31/15 12:23 PM  Result Value Ref Range   WBC 12.2 (H) 4.0 - 10.5 K/uL   RBC 4.41 3.87 -  5.11 MIL/uL   Hemoglobin 13.7 12.0 - 15.0 g/dL   HCT 16.139.2 09.636.0 - 04.546.0 %   MCV 88.9 78.0 - 100.0 fL   MCH 31.1 26.0 - 34.0 pg   MCHC 34.9 30.0 - 36.0 g/dL   RDW 40.913.3 81.111.5 - 91.415.5 %   Platelets 212 150 - 400 K/uL  Comprehensive metabolic panel     Status: Abnormal   Collection Time: 12/31/15 12:23 PM  Result Value Ref Range   Sodium 133 (L) 135 - 145 mmol/L   Potassium 5.5 (H) 3.5 - 5.1 mmol/L   Chloride 104 101 - 111 mmol/L   CO2 20 (L) 22 - 32 mmol/L   Glucose, Bld 75 65 - 99 mg/dL   BUN 17 6 - 20 mg/dL   Creatinine, Ser 7.820.88 0.44 - 1.00 mg/dL   Calcium 8.5 (L) 8.9 - 10.3 mg/dL   Total Protein 5.4 (L) 6.5 - 8.1 g/dL   Albumin 2.4 (L) 3.5 - 5.0 g/dL   AST 42 (H) 15 - 41 U/L   ALT 15 14 - 54 U/L   Alkaline Phosphatase 429 (H) 38 -  126 U/L   Total Bilirubin 1.2 0.3 - 1.2 mg/dL   GFR calc non Af Amer >60 >60 mL/min   GFR calc Af Amer >60 >60 mL/min   Anion gap 9 5 - 15    MAU Course  Procedures Labetalol IV 20 mg x1  MDM Labs ordered and reviewed. BP improved. Presentation, clinical findings, and plan discussed with Dr. Chestine Sporelark. Dr. Chestine Sporelark consulted with Dr. Otho PerlNitsche and he recommends discharge home with close outpt follow up.   Assessment and Plan  31.[redacted] weeks gestation Preeclampsia Reactive NST Discharge home Follow up in 3 days for NST in MAU Strict return precautions discussed   Medication List    TAKE these medications   acetaminophen 500 MG tablet Commonly known as:  TYLENOL Take 1,000 mg by mouth every 6 (six) hours as needed for headache.   prenatal multivitamin Tabs tablet Take 1 tablet by mouth at bedtime.   ranitidine 150 MG capsule Commonly known as:  ZANTAC Take 150 mg by mouth every evening.       Donette LarryMelanie Maggy Wyble, CNM 12/31/2015, 12:13 PM

## 2015-12-31 NOTE — Discharge Instructions (Signed)
Introduction Patient Name: ________________________________________________ Patient Due Date: ____________________ What is a fetal movement count? A fetal movement count is the number of times that you feel your baby move during a certain amount of time. This may also be called a fetal kick count. A fetal movement count is recommended for every pregnant woman. You may be asked to start counting fetal movements as early as week 28 of your pregnancy. Pay attention to when your baby is most active. You may notice your baby's sleep and wake cycles. You may also notice things that make your baby move more. You should do a fetal movement count:  When your baby is normally most active.  At the same time each day. A good time to count movements is while you are resting, after having something to eat and drink. How do I count fetal movements? 1. Find a quiet, comfortable area. Sit, or lie down on your side. 2. Write down the date, the start time and stop time, and the number of movements that you felt between those two times. Take this information with you to your health care visits. 3. For 2 hours, count kicks, flutters, swishes, rolls, and jabs. You should feel at least 10 movements during 2 hours. 4. You may stop counting after you have felt 10 movements. 5. If you do not feel 10 movements in 2 hours, have something to eat and drink. Then, keep resting and counting for 1 hour. If you feel at least 4 movements during that hour, you may stop counting. Contact a health care provider if:  You feel fewer than 4 movements in 2 hours.  Your baby is not moving like he or she usually does. Date: ____________ Start time: ____________ Stop time: ____________ Movements: ____________ Date: ____________ Start time: ____________ Stop time: ____________ Movements: ____________ Date: ____________ Start time: ____________ Stop time: ____________ Movements: ____________ Date: ____________ Start time: ____________  Stop time: ____________ Movements: ____________ Date: ____________ Start time: ____________ Stop time: ____________ Movements: ____________ Date: ____________ Start time: ____________ Stop time: ____________ Movements: ____________ Date: ____________ Start time: ____________ Stop time: ____________ Movements: ____________ Date: ____________ Start time: ____________ Stop time: ____________ Movements: ____________ Date: ____________ Start time: ____________ Stop time: ____________ Movements: ____________ This information is not intended to replace advice given to you by your health care provider. Make sure you discuss any questions you have with your health care provider. Document Released: 02/25/2006 Document Revised: 09/25/2015 Document Reviewed: 03/07/2015 Elsevier Interactive Patient Education  2017 [employer] Preeclampsia and Eclampsia Preeclampsia is a serious condition that develops only during pregnancy. It is also called toxemia of pregnancy. This condition causes high blood pressure along with other symptoms, such as swelling and headaches. These symptoms may develop as the condition gets worse. Preeclampsia may occur at 20 weeks of pregnancy or later. Diagnosing and treating preeclampsia early is very important. If not treated early, it can cause serious problems for you and your baby. One problem it can lead to is eclampsia, which is a condition that causes muscle jerking or shaking (convulsions or seizures) in the mother. Delivering your baby is the best treatment for preeclampsia or eclampsia. Preeclampsia and eclampsia symptoms usually go away after your baby is born. What are the causes? The cause of preeclampsia is not known. What increases the risk? The following risk factors make you more likely to develop preeclampsia:  Being pregnant for the first time.  Having had preeclampsia during a past pregnancy.  Having a family history of preeclampsia.  Having high blood  pressure.  Being pregnant with twins or triplets.  Being 4235 or older.  Being African-American.  Having kidney disease or diabetes.  Having medical conditions such as lupus or blood diseases.  Being very overweight (obese). What are the signs or symptoms? The earliest signs of preeclampsia are:  High blood pressure.  Increased protein in your urine. Your health care provider will check for this at every visit before you give birth (prenatal visit). Other symptoms that may develop as the condition gets worse include:  Severe headaches.  Sudden weight gain.  Swelling of the hands, face, legs, and feet.  Nausea and vomiting.  Vision problems, such as blurred or double vision.  Numbness in the face, arms, legs, and feet.  Urinating less than usual.  Dizziness.  Slurred speech.  Abdominal pain, especially upper abdominal pain.  Convulsions or seizures. Symptoms generally go away after giving birth. How is this diagnosed? There are no screening tests for preeclampsia. Your health care provider will ask you about symptoms and check for signs of preeclampsia during your prenatal visits. You may also have tests that include:  Urine tests.  Blood tests.  Checking your blood pressure.  Monitoring your babys heart rate.  Ultrasound. How is this treated? You and your health care provider will determine the treatment approach that is best for you. Treatment may include:  Having more frequent prenatal exams to check for signs of preeclampsia, if you have an increased risk for preeclampsia.  Bed rest.  Reducing how much salt (sodium) you eat.  Medicine to lower your blood pressure.  Staying in the hospital, if your condition is severe. There, treatment will focus on controlling your blood pressure and the amount of fluids in your body (fluid retention).  You may need to take medicine (magnesium sulfate) to prevent seizures. This medicine may be given as an  injection or through an IV tube.  Delivering your baby early, if your condition gets worse. You may have your labor started with medicine (induced), or you may have a cesarean delivery. Follow these instructions at home: Eating and drinking  Drink enough fluid to keep your urine clear or pale yellow.  Eat a healthy diet that is low in sodium. Do not add salt to your food. Check nutrition labels to see how much sodium a food or beverage contains.  Avoid caffeine. Lifestyle  Do not use any products that contain nicotine or tobacco, such as cigarettes and e-cigarettes. If you need help quitting, ask your health care provider.  Do not use alcohol or drugs.  Avoid stress as much as possible. Rest and get plenty of sleep. General instructions  Take over-the-counter and prescription medicines only as told by your health care provider.  When lying down, lie on your side. This keeps pressure off of your baby.  When sitting or lying down, raise (elevate) your feet. Try putting some pillows underneath your lower legs.  Exercise regularly. Ask your health care provider what kinds of exercise are best for you.  Keep all follow-up and prenatal visits as told by your health care provider. This is important. How is this prevented? To prevent preeclampsia or eclampsia from developing during another pregnancy:  Get proper medical care during pregnancy. Your health care provider may be able to prevent preeclampsia or diagnose and treat it early.  Your health care provider may have you take a low-dose aspirin or a calcium supplement during your next pregnancy.  You may have tests of  your blood pressure and kidney function after giving birth.  Maintain a healthy weight. Ask your health care provider for help managing weight gain during pregnancy.  Work with your health care provider to manage any long-term (chronic) health conditions you have, such as diabetes or kidney problems. Contact a health  care provider if:  You gain more weight than expected.  You have headaches.  You have nausea or vomiting.  You have abdominal pain.  You feel dizzy or light-headed. Get help right away if:  You develop sudden or severe swelling anywhere in your body. This usually happens in the legs.  You gain 5 lbs (2.3 kg) or more during one week.  You have severe:  Abdominal pain.  Headaches.  Dizziness.  Vision problems.  Confusion.  Nausea or vomiting.  You have a seizure.  You have trouble moving any part of your body.  You develop numbness in any part of your body.  You have trouble speaking.  You have any abnormal bleeding.  You pass out. This information is not intended to replace advice given to you by your health care provider. Make sure you discuss any questions you have with your health care provider. Document Released: 01/24/2000 Document Revised: 09/24/2015 Document Reviewed: 09/02/2015 Elsevier Interactive Patient Education  2017 [employer].

## 2016-01-03 ENCOUNTER — Inpatient Hospital Stay (EMERGENCY_DEPARTMENT_HOSPITAL)
Admission: AD | Admit: 2016-01-03 | Discharge: 2016-01-03 | Disposition: A | Discharge status: Home / Self Care | Payer: Commercial Managed Care - PPO | Source: Ambulatory Visit | Attending: Obstetrics and Gynecology | Admitting: Obstetrics and Gynecology

## 2016-01-03 ENCOUNTER — Encounter (HOSPITAL_COMMUNITY): Payer: Self-pay

## 2016-01-03 ENCOUNTER — Inpatient Hospital Stay (HOSPITAL_COMMUNITY)
Admission: AD | Admit: 2016-01-03 | Discharge: 2016-01-07 | DRG: 765 | Disposition: A | Discharge status: Home / Self Care | Payer: Commercial Managed Care - PPO | Source: Ambulatory Visit | Attending: Obstetrics and Gynecology | Admitting: Obstetrics and Gynecology

## 2016-01-03 DIAGNOSIS — O42919 Preterm premature rupture of membranes, unspecified as to length of time between rupture and onset of labor, unspecified trimester: Secondary | ICD-10-CM | POA: Diagnosis present

## 2016-01-03 DIAGNOSIS — K219 Gastro-esophageal reflux disease without esophagitis: Secondary | ICD-10-CM | POA: Diagnosis present

## 2016-01-03 DIAGNOSIS — O133 Gestational [pregnancy-induced] hypertension without significant proteinuria, third trimester: Secondary | ICD-10-CM | POA: Insufficient documentation

## 2016-01-03 DIAGNOSIS — O1404 Mild to moderate pre-eclampsia, complicating childbirth: Secondary | ICD-10-CM | POA: Diagnosis not present

## 2016-01-03 DIAGNOSIS — Z833 Family history of diabetes mellitus: Secondary | ICD-10-CM

## 2016-01-03 DIAGNOSIS — O1493 Unspecified pre-eclampsia, third trimester: Secondary | ICD-10-CM

## 2016-01-03 DIAGNOSIS — Z3A32 32 weeks gestation of pregnancy: Secondary | ICD-10-CM

## 2016-01-03 DIAGNOSIS — Z87891 Personal history of nicotine dependence: Secondary | ICD-10-CM

## 2016-01-03 DIAGNOSIS — O9962 Diseases of the digestive system complicating childbirth: Secondary | ICD-10-CM | POA: Diagnosis present

## 2016-01-03 DIAGNOSIS — Z8249 Family history of ischemic heart disease and other diseases of the circulatory system: Secondary | ICD-10-CM

## 2016-01-03 DIAGNOSIS — O99343 Other mental disorders complicating pregnancy, third trimester: Secondary | ICD-10-CM

## 2016-01-03 DIAGNOSIS — F329 Major depressive disorder, single episode, unspecified: Secondary | ICD-10-CM | POA: Insufficient documentation

## 2016-01-03 DIAGNOSIS — Z6841 Body Mass Index (BMI) 40.0 and over, adult: Secondary | ICD-10-CM

## 2016-01-03 DIAGNOSIS — O42913 Preterm premature rupture of membranes, unspecified as to length of time between rupture and onset of labor, third trimester: Secondary | ICD-10-CM | POA: Diagnosis present

## 2016-01-03 DIAGNOSIS — O288 Other abnormal findings on antenatal screening of mother: Secondary | ICD-10-CM

## 2016-01-03 DIAGNOSIS — O99214 Obesity complicating childbirth: Secondary | ICD-10-CM | POA: Diagnosis present

## 2016-01-03 LAB — PROTEIN / CREATININE RATIO, URINE
CREATININE, URINE: 30 mg/dL
PROTEIN CREATININE RATIO: 5.1 mg/mg{creat} — AB (ref 0.00–0.15)
TOTAL PROTEIN, URINE: 153 mg/dL

## 2016-01-03 LAB — COMPREHENSIVE METABOLIC PANEL
ALT: 23 U/L (ref 14–54)
ANION GAP: 7 (ref 5–15)
AST: 28 U/L (ref 15–41)
Albumin: 2.2 g/dL — ABNORMAL LOW (ref 3.5–5.0)
Alkaline Phosphatase: 412 U/L — ABNORMAL HIGH (ref 38–126)
BUN: 18 mg/dL (ref 6–20)
CHLORIDE: 104 mmol/L (ref 101–111)
CO2: 22 mmol/L (ref 22–32)
Calcium: 8.5 mg/dL — ABNORMAL LOW (ref 8.9–10.3)
Creatinine, Ser: 0.86 mg/dL (ref 0.44–1.00)
Glucose, Bld: 76 mg/dL (ref 65–99)
POTASSIUM: 4.2 mmol/L (ref 3.5–5.1)
Sodium: 133 mmol/L — ABNORMAL LOW (ref 135–145)
Total Bilirubin: 0.4 mg/dL (ref 0.3–1.2)
Total Protein: 5.3 g/dL — ABNORMAL LOW (ref 6.5–8.1)

## 2016-01-03 LAB — CBC
HCT: 38.1 % (ref 36.0–46.0)
Hemoglobin: 13.3 g/dL (ref 12.0–15.0)
MCH: 31.1 pg (ref 26.0–34.0)
MCHC: 34.9 g/dL (ref 30.0–36.0)
MCV: 89.2 fL (ref 78.0–100.0)
PLATELETS: 171 10*3/uL (ref 150–400)
RBC: 4.27 MIL/uL (ref 3.87–5.11)
RDW: 13.4 % (ref 11.5–15.5)
WBC: 11.7 10*3/uL — AB (ref 4.0–10.5)

## 2016-01-03 NOTE — Discharge Instructions (Signed)

## 2016-01-03 NOTE — MAU Note (Signed)
Leaking fld since 2300. States saw alittle yellow color and blood color in fld. Came in with 2 towels between legs. Mild cramping

## 2016-01-03 NOTE — MAU Provider Note (Signed)
Chief Complaint:  Non-stress Test   First Provider Initiated Contact with Patient 01/03/16 1122     HPI: Kayla Bryant is a 30 y.o. G1P0 at 6732w2dwho presents to maternity admissions reporting needing an NST for routine monitoring due to hypertension. Office closed so sent here. Has had elevated BPs in office with elevated protein in urine.  Denies headache or visual changes today.  Does have swelling. . She reports good fetal movement, denies LOF, vaginal bleeding, vaginal itching/burning, urinary symptoms, h/a, dizziness, n/v, diarrhea, constipation or fever/chills.  She denies headache, visual changes or RUQ abdominal pain.  Hypertension  This is a recurrent problem. The current episode started 1 to 4 weeks ago. The problem has been gradually worsening since onset. Associated symptoms include peripheral edema. Pertinent negatives include no anxiety, blurred vision, chest pain, headaches, malaise/fatigue or shortness of breath. There are no associated agents to hypertension. Risk factors: pregnancy. Past treatments include nothing. There are no compliance problems.    RN Note: Kayla BraveLindsey E Seymour, RN    Pt reports to MAU for weekly NST due to the office being closed. Denies any bleeding, leaking of fluid or any other concerns. Reports good fetal movement.       Past Medical History: Past Medical History:  Diagnosis Date  . Depression   . Headache   . Medical history non-contributory   . Pregnancy induced hypertension     Past obstetric history: OB History  Gravida Para Term Preterm AB Living  1            SAB TAB Ectopic Multiple Live Births        0      # Outcome Date GA Lbr Len/2nd Weight Sex Delivery Anes PTL Lv  1 Current               Past Surgical History: Past Surgical History:  Procedure Laterality Date  . NO PAST SURGERIES      Family History: Family History  Problem Relation Age of Onset  . Hypertension Brother   . Diabetes Paternal Uncle   . Diabetes  Maternal Grandfather   . Cancer Paternal Grandmother     Social History: Social History  Substance Use Topics  . Smoking status: Former Smoker    Quit date: 02/11/2005  . Smokeless tobacco: Former NeurosurgeonUser  . Alcohol use No    Allergies:  Allergies  Allergen Reactions  . Lexapro [Escitalopram] Itching  . Sulfa Antibiotics Itching    Meds:  Prescriptions Prior to Admission  Medication Sig Dispense Refill Last Dose  . acetaminophen (TYLENOL) 500 MG tablet Take 1,000 mg by mouth every 6 (six) hours as needed for headache.   Past Week at Unknown time  . Prenatal Vit-Fe Fumarate-FA (PRENATAL MULTIVITAMIN) TABS tablet Take 1 tablet by mouth at bedtime.    12/30/2015 at Unknown time  . ranitidine (ZANTAC) 150 MG capsule Take 150 mg by mouth every evening.   12/30/2015 at Unknown time    I have reviewed patient's Past Medical Hx, Surgical Hx, Family Hx, Social Hx, medications and allergies.   ROS:  Review of Systems  Constitutional: Negative for malaise/fatigue.  Eyes: Negative for blurred vision.  Respiratory: Negative for shortness of breath.   Cardiovascular: Negative for chest pain.  Neurological: Negative for headaches.   Other systems negative  Physical Exam  Patient Vitals for the past 24 hrs:  BP Temp Temp src Pulse Resp  01/03/16 1121 143/99 98.2 F (36.8 C) Oral 95 16  152/108  149/110  145/103 156/112 155/105 152/111 155/111  Constitutional: Well-developed, well-nourished female in no acute distress.  Cardiovascular: normal rate and rhythm Respiratory: normal effort, clear to auscultation bilaterally GI: Abd soft, non-tender, gravid appropriate for gestational age.   No rebound or guarding. MS: Extremities nontender, 1+/2+ edema, normal ROM Neurologic: Alert and oriented x 4.  DTRs brisk, no clonus GU: Neg CVAT.  PELVIC EXAM: deferred    FHT:  Baseline 140 , moderate variability, accelerations present, two variable decelerations Contractions:  Irregular      Labs:   Ref. Range 01/03/2016 12:37  AST Latest Ref Range: 15 - 41 U/L 28  ALT Latest Ref Range: 14 - 54 U/L 23    Ref. Range 01/03/2016 12:37  Creatinine Latest Ref Range: 0.44 - 1.00 mg/dL 4.090.86    Ref. Range 01/03/2016 12:37  WBC Latest Ref Range: 4.0 - 10.5 K/uL 11.7 (H)  RBC Latest Ref Range: 3.87 - 5.11 MIL/uL 4.27  Hemoglobin Latest Ref Range: 12.0 - 15.0 g/dL 81.113.3  HCT Latest Ref Range: 36.0 - 46.0 % 38.1  MCV Latest Ref Range: 78.0 - 100.0 fL 89.2  MCH Latest Ref Range: 26.0 - 34.0 pg 31.1  MCHC Latest Ref Range: 30.0 - 36.0 g/dL 91.434.9  RDW Latest Ref Range: 11.5 - 15.5 % 13.4  Platelets Latest Ref Range: 150 - 400 K/uL 171    Ref. Range 01/03/2016 12:44  Protein Creatinine Ratio Latest Ref Range: 0.00 - 0.15 mg/mgCre 5.10 (H)   Imaging:  [place] Mfm Fetal Bpp Wo Non Stress  Result Date: 12/16/2015 OBSTETRICAL ULTRASOUND: This exam was performed within a [facility]. The OB US report was generated in the AS system, and faxed to the ordering physician.  This report is available in the [employer] [employer] PACS. See the AS Obstetric US report via the Image Link.  [place] Mfm Ob Comp + 14 Wk  Result Date: 12/16/2015 OBSTETRICAL ULTRASOUND: This exam was performed within a [facility]. The OB US report was generated in the AS system, and faxed to the ordering physician.  This report is available in the [employer] [employer] PACS. See the AS Obstetric US report via the Image Link.   MAU Course/MDM: I have ordered labs and reviewed results.  NST reviewed Consult Dr Tenny Crawoss with presentation, exam findings and test results.  Treatments in MAU included NST,, labs.    Assessment: SIUP at 448w1d Preeclampsia Stable Protein/Creat ratio  Plan: Discharge home per order Dr Tenny Crawoss Strict preeclampsia precautions Preterm Labor precautions and fetal kick counts Follow up in Office for prenatal visits and recheck of BP  Encouraged to return here or to other Urgent Care/ED  if she develops worsening of symptoms, increase in pain, fever, or other concerning symptoms.   Pt stable at time of discharge.  [name] CNM, MSN Certified Nurse-Midwife 01/03/2016 11:23 AM

## 2016-01-03 NOTE — Progress Notes (Signed)
Dr Omer JackMumaw on unit. Aware of pt's admission and status including elevated B/Ps and PRROM

## 2016-01-03 NOTE — MAU Note (Signed)
Pt reports to MAU for weekly NST due to the office being closed. Denies any bleeding, leaking of fluid or any other concerns. Reports good fetal movement.

## 2016-01-03 NOTE — Progress Notes (Signed)
Notified MAU provider of pt severe range pressures. At this time was told to not start an IV or HTN protocol.

## 2016-01-04 ENCOUNTER — Encounter (HOSPITAL_COMMUNITY): Payer: Self-pay | Admitting: *Deleted

## 2016-01-04 DIAGNOSIS — Z6841 Body Mass Index (BMI) 40.0 and over, adult: Secondary | ICD-10-CM | POA: Diagnosis not present

## 2016-01-04 DIAGNOSIS — Z8249 Family history of ischemic heart disease and other diseases of the circulatory system: Secondary | ICD-10-CM | POA: Diagnosis not present

## 2016-01-04 DIAGNOSIS — O42919 Preterm premature rupture of membranes, unspecified as to length of time between rupture and onset of labor, unspecified trimester: Secondary | ICD-10-CM | POA: Diagnosis present

## 2016-01-04 DIAGNOSIS — O99214 Obesity complicating childbirth: Secondary | ICD-10-CM | POA: Diagnosis present

## 2016-01-04 DIAGNOSIS — O42913 Preterm premature rupture of membranes, unspecified as to length of time between rupture and onset of labor, third trimester: Secondary | ICD-10-CM | POA: Diagnosis present

## 2016-01-04 DIAGNOSIS — Z833 Family history of diabetes mellitus: Secondary | ICD-10-CM | POA: Diagnosis not present

## 2016-01-04 DIAGNOSIS — O9962 Diseases of the digestive system complicating childbirth: Secondary | ICD-10-CM | POA: Diagnosis present

## 2016-01-04 DIAGNOSIS — Z3A32 32 weeks gestation of pregnancy: Secondary | ICD-10-CM | POA: Diagnosis not present

## 2016-01-04 DIAGNOSIS — O1404 Mild to moderate pre-eclampsia, complicating childbirth: Secondary | ICD-10-CM | POA: Diagnosis present

## 2016-01-04 DIAGNOSIS — Z87891 Personal history of nicotine dependence: Secondary | ICD-10-CM | POA: Diagnosis not present

## 2016-01-04 DIAGNOSIS — K219 Gastro-esophageal reflux disease without esophagitis: Secondary | ICD-10-CM | POA: Diagnosis present

## 2016-01-04 LAB — CBC
HEMATOCRIT: 38.7 % (ref 36.0–46.0)
HEMOGLOBIN: 13.3 g/dL (ref 12.0–15.0)
MCH: 30.8 pg (ref 26.0–34.0)
MCHC: 34.4 g/dL (ref 30.0–36.0)
MCV: 89.6 fL (ref 78.0–100.0)
Platelets: 173 10*3/uL (ref 150–400)
RBC: 4.32 MIL/uL (ref 3.87–5.11)
RDW: 13.4 % (ref 11.5–15.5)
WBC: 11.8 10*3/uL — AB (ref 4.0–10.5)

## 2016-01-04 LAB — TYPE AND SCREEN
ABO/RH(D): B POS
ANTIBODY SCREEN: NEGATIVE

## 2016-01-04 LAB — RPR: RPR: NONREACTIVE

## 2016-01-04 LAB — POCT FERN TEST: POCT FERN TEST: POSITIVE

## 2016-01-04 MED ORDER — SODIUM CHLORIDE 0.9 % IV SOLN
2.0000 g | Freq: Four times a day (QID) | INTRAVENOUS | Status: AC
  Administered 2016-01-04 – 2016-01-05 (×8): 2 g via INTRAVENOUS
  Filled 2016-01-04 (×8): qty 2000

## 2016-01-04 MED ORDER — LACTATED RINGERS IV SOLN
INTRAVENOUS | Status: DC
  Administered 2016-01-04 – 2016-01-05 (×5): via INTRAVENOUS

## 2016-01-04 MED ORDER — HYDRALAZINE HCL 20 MG/ML IJ SOLN: 10.0000 mg | Freq: Once | INTRAMUSCULAR | Status: DC | PRN

## 2016-01-04 MED ORDER — OXYTOCIN BOLUS FROM INFUSION: 500.0000 mL | Freq: Once | INTRAVENOUS | Status: DC

## 2016-01-04 MED ORDER — ACETAMINOPHEN 325 MG PO TABS
650.0000 mg | ORAL_TABLET | ORAL | Status: DC | PRN
  Administered 2016-01-04 – 2016-01-05 (×3): 650 mg via ORAL
  Filled 2016-01-04 (×3): qty 2

## 2016-01-04 MED ORDER — DEXTROSE 5 % IV SOLN
500.0000 mg | INTRAVENOUS | Status: AC
  Administered 2016-01-04 – 2016-01-05 (×2): 500 mg via INTRAVENOUS
  Filled 2016-01-04 (×2): qty 500

## 2016-01-04 MED ORDER — AZITHROMYCIN 250 MG PO TABS: 500.0000 mg | ORAL_TABLET | Freq: Every day | ORAL | Status: DC

## 2016-01-04 MED ORDER — BETAMETHASONE SOD PHOS & ACET 6 (3-3) MG/ML IJ SUSP
12.0000 mg | Freq: Once | INTRAMUSCULAR | Status: AC
  Administered 2016-01-04: 12 mg via INTRAMUSCULAR
  Filled 2016-01-04: qty 2

## 2016-01-04 MED ORDER — SOD CITRATE-CITRIC ACID 500-334 MG/5ML PO SOLN
30.0000 mL | ORAL | Status: DC | PRN
  Administered 2016-01-05: 30 mL via ORAL
  Filled 2016-01-04: qty 15

## 2016-01-04 MED ORDER — LACTATED RINGERS IV SOLN
INTRAVENOUS | Status: DC
  Administered 2016-01-05 (×2): via INTRAVENOUS

## 2016-01-04 MED ORDER — OXYCODONE-ACETAMINOPHEN 5-325 MG PO TABS: 1.0000 | ORAL_TABLET | ORAL | Status: DC | PRN

## 2016-01-04 MED ORDER — AMOXICILLIN 500 MG PO CAPS: 500.0000 mg | ORAL_CAPSULE | Freq: Three times a day (TID) | ORAL | Status: DC

## 2016-01-04 MED ORDER — OXYTOCIN 40 UNITS IN LACTATED RINGERS INFUSION - SIMPLE MED: 2.5000 [IU]/h | INTRAVENOUS | Status: DC

## 2016-01-04 MED ORDER — LACTATED RINGERS IV SOLN: 500.0000 mL | INTRAVENOUS | Status: DC | PRN

## 2016-01-04 MED ORDER — LIDOCAINE HCL (PF) 1 % IJ SOLN: 30.0000 mL | INTRAMUSCULAR | Status: DC | PRN

## 2016-01-04 MED ORDER — MAGNESIUM SULFATE 50 % IJ SOLN
2.0000 g/h | INTRAVENOUS | Status: DC
  Administered 2016-01-04 (×2): 2 g/h via INTRAVENOUS
  Filled 2016-01-04 (×2): qty 80

## 2016-01-04 MED ORDER — OXYCODONE-ACETAMINOPHEN 5-325 MG PO TABS: 2.0000 | ORAL_TABLET | ORAL | Status: DC | PRN

## 2016-01-04 MED ORDER — MAGNESIUM SULFATE BOLUS VIA INFUSION
4.0000 g | Freq: Once | INTRAVENOUS | Status: AC
  Administered 2016-01-04: 4 g via INTRAVENOUS
  Filled 2016-01-04: qty 500

## 2016-01-04 MED ORDER — BUTORPHANOL TARTRATE 1 MG/ML IJ SOLN: 1.0000 mg | INTRAMUSCULAR | Status: DC | PRN

## 2016-01-04 MED ORDER — ONDANSETRON HCL 4 MG/2ML IJ SOLN
4.0000 mg | Freq: Four times a day (QID) | INTRAMUSCULAR | Status: DC | PRN
  Administered 2016-01-05: 4 mg via INTRAVENOUS

## 2016-01-04 MED ORDER — LABETALOL HCL 5 MG/ML IV SOLN
20.0000 mg | INTRAVENOUS | Status: DC | PRN
  Administered 2016-01-04: 20 mg via INTRAVENOUS
  Filled 2016-01-04: qty 4

## 2016-01-04 NOTE — Progress Notes (Signed)
Sterile spec exam

## 2016-01-04 NOTE — MAU Provider Note (Signed)
Chief Complaint:  Rupture of Membranes    HPI: Kayla Bryant is a [age] y.o. G1P0 at 5449w3d who presents to maternity admissions reporting leaking fluid.  About 10:50 PM was in bed, and felt a large gush of fluid that soaked the bed, clear fluid. Contractions started after the water broke, about every 4-5 min, painful but patient can talk/breathe through them easily. Has known mild preeclampsia, with proteinuria and elevated BPs. No current vision changes, no RUQ/epigastric pain, mild headache, went away with Tylenol but coming back slightly.   Denies vaginal bleeding. Good fetal movement.    Past Medical History: Past Medical History:  Diagnosis Date  . Depression   . Headache   . Medical history non-contributory   . Pregnancy induced hypertension     Past obstetric history: OB History  Gravida Para Term Preterm AB Living  1            SAB TAB Ectopic Multiple Live Births        0      # Outcome Date GA Lbr Len/2nd Weight Sex Delivery Anes PTL Lv  1 Current               Past Surgical History: Past Surgical History:  Procedure Laterality Date  . NO PAST SURGERIES       Family History: Family History  Problem Relation Age of Onset  . Hypertension Brother   . Diabetes Paternal Uncle   . Diabetes Maternal Grandfather   . Cancer Paternal Grandmother     Social History: Social History  Substance Use Topics  . Smoking status: Former Smoker    Quit date: 02/11/2005  . Smokeless tobacco: Former NeurosurgeonUser  . Alcohol use No    Allergies:  Allergies  Allergen Reactions  . Lexapro [Escitalopram] Itching  . Sulfa Antibiotics Itching    Meds:  Prescriptions Prior to Admission  Medication Sig Dispense Refill Last Dose  . acetaminophen (TYLENOL) 500 MG tablet Take 1,000 mg by mouth every 6 (six) hours as needed for mild pain, moderate pain or headache.    01/03/2016 at Unknown time  . Prenatal Vit-Fe Fumarate-FA (PRENATAL MULTIVITAMIN) TABS tablet Take 1 tablet by mouth  at bedtime.    01/03/2016 at Unknown time  . ranitidine (ZANTAC) 150 MG tablet Take 150 mg by mouth 2 (two) times daily as needed for heartburn.   Past Week at Unknown time    I have reviewed patient's Past Medical Hx, Surgical Hx, Family Hx, Social Hx, medications and allergies.   ROS:  A comprehensive ROS was negative except per HPI.    Physical Exam  Patient Vitals for the past 24 hrs:  BP Temp Resp Height Weight  01/03/16 2342 (!) 149/111 97.5 F (36.4 C) 18 5\' 4"  (1.626 m) 237 lb 6.4 oz (107.7 kg)   Constitutional: Well-developed, well-nourished female in no acute distress.  Cardiovascular: normal rate, rhythm, no murmurs Respiratory: normal effort, CTAB GI: Abd soft, non-tender, gravid appropriate for gestational age. Pos BS x 4 MS: Extremities nontender, no edema, normal ROM Neurologic: Alert and oriented x 4.  GU: Neg CVAT. Pelvic: NEFG with condylomas/skin tags on groin area, gross ROM, no blood, cervix clean but difficult to visualize due to redundant vaginal tissue. Did not perform SVE due to preterm ROM.   FHT:  Baseline 150, moderate variability, no accelerations present, multiple frequent variable decelerations  Contractions: Rare   Labs: Results for orders placed or performed during the hospital encounter of 01/03/16 (from  the past 24 hour(s))  CBC     Status: Abnormal   Collection Time: 01/03/16 12:37 PM  Result Value Ref Range   WBC 11.7 (H) 4.0 - 10.5 K/uL   RBC 4.27 3.87 - 5.11 MIL/uL   Hemoglobin 13.3 12.0 - 15.0 g/dL   HCT 16.138.1 09.636.0 - 04.546.0 %   MCV 89.2 78.0 - 100.0 fL   MCH 31.1 26.0 - 34.0 pg   MCHC 34.9 30.0 - 36.0 g/dL   RDW 40.913.4 81.111.5 - 91.415.5 %   Platelets 171 150 - 400 K/uL  Comprehensive metabolic panel     Status: Abnormal   Collection Time: 01/03/16 12:37 PM  Result Value Ref Range   Sodium 133 (L) 135 - 145 mmol/L   Potassium 4.2 3.5 - 5.1 mmol/L   Chloride 104 101 - 111 mmol/L   CO2 22 22 - 32 mmol/L   Glucose, Bld 76 65 - 99 mg/dL   BUN  18 6 - 20 mg/dL   Creatinine, Ser 7.820.86 0.44 - 1.00 mg/dL   Calcium 8.5 (L) 8.9 - 10.3 mg/dL   Total Protein 5.3 (L) 6.5 - 8.1 g/dL   Albumin 2.2 (L) 3.5 - 5.0 g/dL   AST 28 15 - 41 U/L   ALT 23 14 - 54 U/L   Alkaline Phosphatase 412 (H) 38 - 126 U/L   Total Bilirubin 0.4 0.3 - 1.2 mg/dL   GFR calc non Af Amer >60 >60 mL/min   GFR calc Af Amer >60 >60 mL/min   Anion gap 7 5 - 15  Protein / creatinine ratio, urine     Status: Abnormal   Collection Time: 01/03/16 12:44 PM  Result Value Ref Range   Creatinine, Urine 30.00 mg/dL   Total Protein, Urine 153 mg/dL   Protein Creatinine Ratio 5.10 (H) 0.00 - 0.15 mg/mg[Cre]    Imaging:  [place] Mfm Fetal Bpp Wo Non Stress  Result Date: 12/16/2015 OBSTETRICAL ULTRASOUND: This exam was performed within a [facility]. The OB US report was generated in the AS system, and faxed to the ordering physician.  This report is available in the [employer] [employer] PACS. See the AS Obstetric US report via the Image Link.  [place] Mfm Ob Comp + 14 Wk  Result Date: 12/16/2015 OBSTETRICAL ULTRASOUND: This exam was performed within a [facility]. The OB US report was generated in the AS system, and faxed to the ordering physician.  This report is available in the [facility] [employer] PACS. See the AS Obstetric US report via the Image Link.   MAU Course:  12:43 AM - Spoke with Dr. Tenny Crawoss, who agrees to admit patient. Imminent delivery likely, will give dose of betamethasone now (received first course on 11/3-4).    MDM: Plan of care reviewed with patient, including labs and tests ordered and medical treatment.   Assessment/Plan: PPROM Category II-III strip  ADMIT TO INPATIENT BMZ x 1 given     [name], DO OB Fellow [facility] 01/04/2016 12:17 AM

## 2016-01-04 NOTE — Progress Notes (Signed)
Dr Omer JackMumaw at bedside. Couple aware baby may have to be transferred due to acuity in NICU.

## 2016-01-04 NOTE — Progress Notes (Signed)
Pt voided on large peripad in bed

## 2016-01-04 NOTE — H&P (Signed)
Leonides GrillsStephanie L Debruler is a 30 y.o. female presenting for Leaking fluid  29 Yo G4P0030 @ 32+3 presents for leaking fluid. In MAU she was confirmed to be PPROM. Her pregnancy has been complicated by Pre-eclampsia without severe features. Most recently she was in MAU for a NST an BP check on 01/03/2016. BPs were elevated greater than previous but not yet sustained severe range. Her labs were all WNL.  In MAU, variables were noted with contractions.  Vertex presentation confirmed by bedside US  OB History    Gravida Para Term Preterm AB Living   1             SAB TAB Ectopic Multiple Live Births         0       Past Medical History:  Diagnosis Date  . Depression   . Headache   . Medical history non-contributory   . Pregnancy induced hypertension    Past Surgical History:  Procedure Laterality Date  . NO PAST SURGERIES     Family History: family history includes Cancer in her paternal grandmother; Diabetes in her maternal grandfather and paternal uncle; Hypertension in her brother. Social History:  reports that she quit smoking about 10 years ago. She has quit using smokeless tobacco. She reports that she does not drink alcohol or use drugs.     Maternal Diabetes: No Genetic Screening: Declined Maternal Ultrasounds/Referrals: Normal Fetal Ultrasounds or other Referrals:  None Maternal Substance Abuse:  No Significant Maternal Medications:  None Significant Maternal Lab Results:  Lab values include: Other: 11/21 U PR:Cr 5.78, 11/24 5.10 Other Comments:  None  ROS History Dilation:  (unable to see cervix by spec exam) Blood pressure (!) 149/111, temperature 97.5 F (36.4 C), resp. rate 18, height 5\' 4"  (1.626 m), weight 107.7 kg (237 lb 6.4 oz), unknown if currently breastfeeding. Exam Physical Exam  Prenatal labs: ABO, Rh: --/--/B POS, B POS (11/03 [id number]) Antibody: NEG (11/03 0928) Rubella:  Imm RPR:   NR HBsAg:   Neg HIV:   NR GBS:   Unknown  US on 11/21 with EFW 3#7, 23  %  Assessment/Plan: 1) Admit 2) Amp & Amox for latency 3) BMZ x 1. Pt previously completed BMZ course on 11/3 & 11/4 4) Magnesium sulfate for neuroprotection. If patient does not labor then when in labor she will need Magnesium sulfate for seizure prophylaxis 5) NICU currently without beds. Given recurrent variables the patient is not a medically appropriate candidate for transfer to an alternative facility.   Smiley Birr H. 01/04/2016, 1:00 AM

## 2016-01-05 ENCOUNTER — Encounter (HOSPITAL_COMMUNITY): Payer: Self-pay

## 2016-01-05 ENCOUNTER — Inpatient Hospital Stay (HOSPITAL_COMMUNITY): Payer: Commercial Managed Care - PPO | Admitting: Anesthesiology

## 2016-01-05 ENCOUNTER — Encounter (HOSPITAL_COMMUNITY)
Admission: AD | Disposition: A | Discharge status: Home / Self Care | Payer: Self-pay | Source: Ambulatory Visit | Attending: Obstetrics and Gynecology

## 2016-01-05 ENCOUNTER — Inpatient Hospital Stay (HOSPITAL_COMMUNITY): Payer: Commercial Managed Care - PPO

## 2016-01-05 LAB — COMPREHENSIVE METABOLIC PANEL
ALBUMIN: 2.3 g/dL — AB (ref 3.5–5.0)
ALT: 19 U/L (ref 14–54)
ANION GAP: 8 (ref 5–15)
AST: 35 U/L (ref 15–41)
Alkaline Phosphatase: 403 U/L — ABNORMAL HIGH (ref 38–126)
BILIRUBIN TOTAL: 1 mg/dL (ref 0.3–1.2)
BUN: 25 mg/dL — ABNORMAL HIGH (ref 6–20)
CHLORIDE: 106 mmol/L (ref 101–111)
CO2: 22 mmol/L (ref 22–32)
Calcium: 7 mg/dL — ABNORMAL LOW (ref 8.9–10.3)
Creatinine, Ser: 0.97 mg/dL (ref 0.44–1.00)
GFR calc Af Amer: >60 mL/min (ref >60)
GLUCOSE: 112 mg/dL — AB (ref 65–99)
POTASSIUM: 4.7 mmol/L (ref 3.5–5.1)
Sodium: 136 mmol/L (ref 135–145)
TOTAL PROTEIN: 5.8 g/dL — AB (ref 6.5–8.1)

## 2016-01-05 LAB — CBC
HEMATOCRIT: 38 % (ref 36.0–46.0)
Hemoglobin: 13 g/dL (ref 12.0–15.0)
MCH: 31.1 pg (ref 26.0–34.0)
MCHC: 34.2 g/dL (ref 30.0–36.0)
MCV: 90.9 fL (ref 78.0–100.0)
PLATELETS: 245 10*3/uL (ref 150–400)
RBC: 4.18 MIL/uL (ref 3.87–5.11)
RDW: 14.1 % (ref 11.5–15.5)
WBC: 17 10*3/uL — AB (ref 4.0–10.5)

## 2016-01-05 LAB — MAGNESIUM: MAGNESIUM: 4.6 mg/dL — AB (ref 1.7–2.4)

## 2016-01-05 SURGERY — Surgical Case
Anesthesia: Spinal

## 2016-01-05 MED ORDER — MEPERIDINE HCL 25 MG/ML IJ SOLN
INTRAMUSCULAR | Status: AC
  Filled 2016-01-05: qty 1

## 2016-01-05 MED ORDER — OXYTOCIN 10 UNIT/ML IJ SOLN
INTRAMUSCULAR | Status: AC
  Filled 2016-01-05: qty 4

## 2016-01-05 MED ORDER — FENTANYL CITRATE (PF) 100 MCG/2ML IJ SOLN
INTRAMUSCULAR | Status: DC | PRN
Start: 2016-01-05 — End: 2016-01-06
  Administered 2016-01-05: 10 ug via INTRATHECAL

## 2016-01-05 MED ORDER — OXYTOCIN 10 UNIT/ML IJ SOLN
INTRAVENOUS | Status: DC | PRN
  Administered 2016-01-05: 40 [IU] via INTRAVENOUS

## 2016-01-05 MED ORDER — PHENYLEPHRINE 8 MG IN D5W 100 ML (0.08MG/ML) PREMIX OPTIME
INJECTION | INTRAVENOUS | Status: DC | PRN
Start: 2016-01-05 — End: 2016-01-06
  Administered 2016-01-05: 40 ug/min via INTRAVENOUS

## 2016-01-05 MED ORDER — MORPHINE SULFATE (PF) 0.5 MG/ML IJ SOLN
INTRAMUSCULAR | Status: DC | PRN
  Administered 2016-01-05: .3 mg via INTRAVENOUS
  Administered 2016-01-05: .2 mg via INTRATHECAL

## 2016-01-05 MED ORDER — CEFAZOLIN SODIUM-DEXTROSE 2-3 GM-% IV SOLR
INTRAVENOUS | Status: DC | PRN
  Administered 2016-01-05: 2 g via INTRAVENOUS

## 2016-01-05 MED ORDER — ONDANSETRON HCL 4 MG/2ML IJ SOLN
INTRAMUSCULAR | Status: AC
  Filled 2016-01-05: qty 2

## 2016-01-05 MED ORDER — CEFAZOLIN SODIUM-DEXTROSE 2-4 GM/100ML-% IV SOLN
INTRAVENOUS | Status: AC
  Filled 2016-01-05: qty 100

## 2016-01-05 MED ORDER — FENTANYL CITRATE (PF) 100 MCG/2ML IJ SOLN
INTRAMUSCULAR | Status: AC
Start: 2016-01-05 — End: 2016-01-05
  Filled 2016-01-05: qty 2

## 2016-01-05 MED ORDER — BUPIVACAINE IN DEXTROSE 0.75-8.25 % IT SOLN
INTRATHECAL | Status: DC | PRN
  Administered 2016-01-05: 1.6 mL via INTRATHECAL

## 2016-01-05 MED ORDER — MORPHINE SULFATE-NACL 0.5-0.9 MG/ML-% IV SOSY
PREFILLED_SYRINGE | INTRAVENOUS | Status: AC
  Filled 2016-01-05: qty 1

## 2016-01-05 MED ORDER — SCOPOLAMINE 1 MG/3DAYS TD PT72
MEDICATED_PATCH | TRANSDERMAL | Status: AC
  Filled 2016-01-05: qty 1

## 2016-01-05 MED ORDER — MEPERIDINE HCL 25 MG/ML IJ SOLN
INTRAMUSCULAR | Status: DC | PRN
  Administered 2016-01-05: 12.5 mg via INTRAVENOUS

## 2016-01-05 MED ORDER — DEXAMETHASONE SODIUM PHOSPHATE 4 MG/ML IJ SOLN
INTRAMUSCULAR | Status: AC
  Filled 2016-01-05: qty 1

## 2016-01-05 MED ORDER — SCOPOLAMINE 1 MG/3DAYS TD PT72SCOPOLAMINE 1 MG/3DAYS
MEDICATED_PATCH | TRANSDERMAL | Status: DC | PRN
Start: 2016-01-05 — End: 2016-01-06
  Administered 2016-01-05: 1 via TRANSDERMAL

## 2016-01-05 MED ORDER — BUPIVACAINE HCL (PF) 0.25 % IJ SOLN
INTRAMUSCULAR | Status: AC
  Filled 2016-01-05: qty 30

## 2016-01-05 MED ORDER — DEXAMETHASONE SODIUM PHOSPHATE 4 MG/ML IJ SOLN
INTRAMUSCULAR | Status: DC | PRN
  Administered 2016-01-05: 4 mg via INTRAVENOUS

## 2016-01-05 SURGICAL SUPPLY — 31 items
CHLORAPREP W/TINT 26ML (MISCELLANEOUS) ×3 IMPLANT
CLAMP CORD UMBIL (MISCELLANEOUS) IMPLANT
CLOTH BEACON ORANGE TIMEOUT ST (SAFETY) ×3 IMPLANT
DERMABOND ADVANCED (GAUZE/BANDAGES/DRESSINGS) ×2
DERMABOND ADVANCED .7 DNX12 (GAUZE/BANDAGES/DRESSINGS) ×1 IMPLANT
DRSG OPSITE POSTOP 4X10 (GAUZE/BANDAGES/DRESSINGS) ×3 IMPLANT
ELECT REM PT RETURN 9FT ADLT (ELECTROSURGICAL) ×3
ELECTRODE REM PT RTRN 9FT ADLT (ELECTROSURGICAL) ×1 IMPLANT
EXTRACTOR VACUUM M CUP 4 TUBE (SUCTIONS) IMPLANT
EXTRACTOR VACUUM M CUP 4' TUBE (SUCTIONS)
GLOVE BIO SURGEON STRL SZ7 (GLOVE) ×3 IMPLANT
GLOVE BIOGEL PI IND STRL 7.0 (GLOVE) ×1 IMPLANT
GLOVE BIOGEL PI INDICATOR 7.0 (GLOVE) ×2
GOWN STRL REUS W/TWL LRG LVL3 (GOWN DISPOSABLE) ×6 IMPLANT
KIT ABG SYR 3ML LUER SLIP (SYRINGE) IMPLANT
NEEDLE HYPO 22GX1.5 SAFETY (NEEDLE) IMPLANT
NEEDLE HYPO 25X5/8 SAFETYGLIDE (NEEDLE) IMPLANT
NS IRRIG 1000ML POUR BTL (IV SOLUTION) ×3 IMPLANT
PACK C SECTION WH (CUSTOM PROCEDURE TRAY) ×3 IMPLANT
PAD OB MATERNITY 4.3X12.25 (PERSONAL CARE ITEMS) ×3 IMPLANT
PENCIL SMOKE EVAC W/HOLSTER (ELECTROSURGICAL) ×3 IMPLANT
RTRCTR C-SECT PINK 25CM LRG (MISCELLANEOUS) ×3 IMPLANT
SUT CHROMIC 1 CTX 36 (SUTURE) ×6 IMPLANT
SUT CHROMIC 2 0 CT 1 (SUTURE) ×3 IMPLANT
SUT PDS AB 0 CTX 60 (SUTURE) ×3 IMPLANT
SUT VIC AB 2-0 CT1 27 (SUTURE) ×2
SUT VIC AB 2-0 CT1 TAPERPNT 27 (SUTURE) ×1 IMPLANT
SUT VIC AB 4-0 KS 27 (SUTURE) IMPLANT
SYR 30ML LL (SYRINGE) IMPLANT
TOWEL OR 17X24 6PK STRL BLUE (TOWEL DISPOSABLE) ×3 IMPLANT
TRAY FOLEY CATH SILVER 14FR (SET/KITS/TRAYS/PACK) ×3 IMPLANT

## 2016-01-05 NOTE — Consult Note (Signed)
[facility]  Delivery Note:  C-section       01/05/2016  11:35 PM  I was called to the operating room at the request of the patient's obstetrician (Dr. Ross) for a primary c-section.  PRENATAL HX:  This is a 29 year old G1P0 at 32 and 4/[redacted] weeks gestation who was admitted for PPROM ~11 pm on 11/24 (ROM ~48 hours).  Her pregnancy has been complicated by pre-eclampsia.  She received BMZ x3 (11/3, 11/4, and 11/25).  Upon this admission she was started on latency antibiotics as well as magnesium.  BPP today was 2/8 x2 and fetal heart rate had poor variability so infant was delivered by c-section  DELIVERY:  Nuchal cord noted at delivery and fluid was thick meconium.  He had decreased tone and no cry at delivery so cord clamping was not delayed.  Thick meconium was suctioned from the oropharynx and PPV initiated for apnea and continued on and off for apnea for 3 and a half minutes.  O2 saturations were in 90s so CPAP 21% was given until 6 minutes and O2 saturations remained appropriate when CPAP was removed.  APGARs 2, 7 and 8.  He was admitted to the NICU in RA.    _____________________ Electronically Signed By: Ramzi Brathwaite, MD Neonatologist  

## 2016-01-05 NOTE — Anesthesia Procedure Notes (Signed)
Spinal  Patient location during procedure: OR Start time: 01/05/2016 11:11 PM End time: 01/05/2016 11:12 PM Staffing Anesthesiologist: Cecile HearingURK, STEPHEN EDWARD Performed: anesthesiologist  Preanesthetic Checklist Completed: patient identified, surgical consent, pre-op evaluation, timeout performed, IV checked, risks and benefits discussed and monitors and equipment checked Spinal Block Patient position: sitting Prep: site prepped and draped and DuraPrep Patient monitoring: continuous pulse ox and blood pressure Approach: midline Location: L3-4 Injection technique: single-shot Needle Needle type: Pencan  Needle gauge: 25 G Needle length: 9 cm Assessment Sensory level: T4 Additional Notes Functioning IV was confirmed and monitors were applied. Sterile prep and drape, including hand hygiene, mask and sterile gloves were used. The patient was positioned and the spine was prepped. The skin was anesthetized with lidocaine.  Free flow of clear CSF was obtained prior to injecting local anesthetic into the CSF.  The spinal needle aspirated freely following injection.  The needle was carefully withdrawn.  The patient tolerated the procedure well. Consent was obtained prior to procedure with all questions answered and concerns addressed. Risks including but not limited to bleeding, infection, nerve damage, paralysis, failed block, inadequate analgesia, allergic reaction, high spinal, itching and headache were discussed and the patient wished to proceed.   Arrie AranStephen Turk, MD

## 2016-01-05 NOTE — Progress Notes (Signed)
Viable infant female @2337  born OR #9. Dr Eulah PontMurphy neo in OR and Nsy team. Infant band applied [phone]#[id number]. Infant bands applied to mother and father#[id number]. Infant taken NICU via isolate at 2350 w/ father at side.

## 2016-01-05 NOTE — Progress Notes (Signed)
Patient ID: Kayla GrillsStephanie L Bryant, female   DOB: Jan 18, 1986, 30 y.o.   MRN: [id number]  Repeat bedside BPP performed by me. Repeat BPP still 2/8. Its been 4 hours since last BPP. If fetal depression was related to maternal magnesium level it should have resolved by now. Given persistent non-reassuring status I have recommended proceeding with primary cesarean section. R/B/A of the procedure have been discussed with the patient and her husband at length and they wish to proceed. NICU has been notified. Pt will need 24 hours of Magnesium PP

## 2016-01-05 NOTE — Anesthesia Preprocedure Evaluation (Signed)
Anesthesia Evaluation  Patient identified by MRN, date of birth, ID band Patient awake    Reviewed: Allergy & Precautions, NPO status , Patient's Chart, lab work & pertinent test results  Airway Mallampati: II  TM Distance: <3 FB Neck ROM: Full    Dental  (+) Teeth Intact, Dental Advisory Given   Pulmonary former smoker,    Pulmonary exam normal breath sounds clear to auscultation       Cardiovascular hypertension, Normal cardiovascular exam Rhythm:Regular Rate:Normal     Neuro/Psych  Headaches, PSYCHIATRIC DISORDERS Depression    GI/Hepatic Neg liver ROS, GERD  Medicated and Controlled,  Endo/Other  Morbid obesity  Renal/GU negative Renal ROS     Musculoskeletal negative musculoskeletal ROS (+)   Abdominal   Peds  Hematology negative hematology ROS (+) Plt 245k   Anesthesia Other Findings Day of surgery medications reviewed with the patient.  Reproductive/Obstetrics (+) Pregnancy PPROM, Pre-eclampsia, non-reassuring fetal heart rate                              Anesthesia Physical Anesthesia Plan  ASA: III and emergent  Anesthesia Plan: Spinal   Post-op Pain Management:    Induction:   Airway Management Planned:   Additional Equipment:   Intra-op Plan:   Post-operative Plan:   Informed Consent: I have reviewed the patients History and Physical, chart, labs and discussed the procedure including the risks, benefits and alternatives for the proposed anesthesia with the patient or authorized representative who has indicated his/her understanding and acceptance.   Dental advisory given  Plan Discussed with: CRNA, Anesthesiologist and Surgeon  Anesthesia Plan Comments: (Discussed risks and benefits of and differences between spinal and general. Discussed risks of spinal including headache, backache, failure, bleeding, infection, and nerve damage. Patient consents to spinal.  Questions answered. Coagulation studies and platelet count acceptable.)        Anesthesia Quick Evaluation

## 2016-01-05 NOTE — Progress Notes (Signed)
Patient ID: Kayla GrillsStephanie L Bryant, female   DOB: [birth year], 30 y.o.   MRN: [id number]  HD#2 30 yo G4P0030 @ 32+4 with PPROM & pre-eclampsia without severe features  S: Feeling well overall, continues to leak fluid. Active FM O:  Vitals:   01/05/16 0300 01/05/16 0430 01/05/16 0757 01/05/16 0758  BP:  (!) 150/92  (!) 145/93  Pulse:  86  86  Resp: 18 18    Temp:  98.2 F (36.8 C) 98.1 F (36.7 C)   TempSrc:  Oral Oral   Weight:      Height:       AOX3, NAD Reactive NST, cat 1 tracing Cvx deferred   A/P 30 yo G4P0030 @ 32+4 with PPROM & pre-eclampsia without severe features 1) Continue hospital bedrest 2) Magnesium Sulfate for neuroprophylaxis. Pt has completed 12 hours. Will D/C. Pt will need Mag again for seizure prophylaxis when delivery deemed appropriate. 3) SCDs  4) S/P full BMZ course 11/3&4 and rescue dose 11/25 5) Continue abx for latency

## 2016-01-05 NOTE — Progress Notes (Signed)
   Called By RN for flat fetal tracing. Magnesium sulfate has been off for about 5 hours. BPP performed for 2/8. Baby did have some non-sustained breathing movements but not 30 seconds. Labs ordered but not performed before patient went for BPP.   Results for orders placed or performed during the hospital encounter of 01/03/16 (from the past 24 hour(s))  CBC     Status: Abnormal   Collection Time: 01/05/16  5:15 PM  Result Value Ref Range   WBC 17.0 (H) 4.0 - 10.5 K/uL   RBC 4.18 3.87 - 5.11 MIL/uL   Hemoglobin 13.0 12.0 - 15.0 g/dL   HCT 29.538.0 62.136.0 - 30.846.0 %   MCV 90.9 78.0 - 100.0 fL   MCH 31.1 26.0 - 34.0 pg   MCHC 34.2 30.0 - 36.0 g/dL   RDW 65.714.1 84.611.5 - 96.215.5 %   Platelets 245 150 - 400 K/uL  Comprehensive metabolic panel     Status: Abnormal   Collection Time: 01/05/16  5:15 PM  Result Value Ref Range   Sodium 136 135 - 145 mmol/L   Potassium 4.7 3.5 - 5.1 mmol/L   Chloride 106 101 - 111 mmol/L   CO2 22 22 - 32 mmol/L   Glucose, Bld 112 (H) 65 - 99 mg/dL   BUN 25 (H) 6 - 20 mg/dL   Creatinine, Ser 9.520.97 0.44 - 1.00 mg/dL   Calcium 7.0 (L) 8.9 - 10.3 mg/dL   Total Protein 5.8 (L) 6.5 - 8.1 g/dL   Albumin 2.3 (L) 3.5 - 5.0 g/dL   AST 35 15 - 41 U/L   ALT 19 14 - 54 U/L   Alkaline Phosphatase 403 (H) 38 - 126 U/L   Total Bilirubin 1.0 0.3 - 1.2 mg/dL   GFR calc non Af Amer >60 >60 mL/min   GFR calc Af Amer >60 >60 mL/min   Anion gap 8 5 - 15  Magnesium     Status: Abnormal   Collection Time: 01/05/16  5:15 PM  Result Value Ref Range   Magnesium 4.6 (H) 1.7 - 2.4 mg/dL   NST FHR [id number], category 2 tracing  A/P 1) Fetal tracing likely related to residual Magnesium effect. Mag level 4.6 and Cr .97. Pts clearance of magnesium delayed d/t decreased creatinine clearance.  2) Will monitor tracing closely. May need to consider moving toward delivery

## 2016-01-06 ENCOUNTER — Encounter (HOSPITAL_COMMUNITY): Payer: Self-pay | Admitting: Obstetrics and Gynecology

## 2016-01-06 LAB — COMPREHENSIVE METABOLIC PANEL
ALK PHOS: 365 U/L — AB (ref 38–126)
ALT: 19 U/L (ref 14–54)
AST: 35 U/L (ref 15–41)
Albumin: 2.2 g/dL — ABNORMAL LOW (ref 3.5–5.0)
Anion gap: 7 (ref 5–15)
BILIRUBIN TOTAL: 0.4 mg/dL (ref 0.3–1.2)
BUN: 24 mg/dL — AB (ref 6–20)
CALCIUM: 6.9 mg/dL — AB (ref 8.9–10.3)
CO2: 22 mmol/L (ref 22–32)
CREATININE: 0.99 mg/dL (ref 0.44–1.00)
Chloride: 104 mmol/L (ref 101–111)
GFR calc Af Amer: >60 mL/min (ref >60)
Glucose, Bld: 106 mg/dL — ABNORMAL HIGH (ref 65–99)
Potassium: 5.4 mmol/L — ABNORMAL HIGH (ref 3.5–5.1)
Sodium: 133 mmol/L — ABNORMAL LOW (ref 135–145)
TOTAL PROTEIN: 5.6 g/dL — AB (ref 6.5–8.1)

## 2016-01-06 LAB — CBC
HEMATOCRIT: 41 % (ref 36.0–46.0)
Hemoglobin: 14.1 g/dL (ref 12.0–15.0)
MCH: 31.3 pg (ref 26.0–34.0)
MCHC: 34.4 g/dL (ref 30.0–36.0)
MCV: 91.1 fL (ref 78.0–100.0)
Platelets: 240 10*3/uL (ref 150–400)
RBC: 4.5 MIL/uL (ref 3.87–5.11)
RDW: 14 % (ref 11.5–15.5)
WBC: 24.6 10*3/uL — AB (ref 4.0–10.5)

## 2016-01-06 LAB — MAGNESIUM: MAGNESIUM: 5.7 mg/dL — AB (ref 1.7–2.4)

## 2016-01-06 MED ORDER — SODIUM CHLORIDE 0.9% FLUSH: 3.0000 mL | INTRAVENOUS | Status: DC | PRN

## 2016-01-06 MED ORDER — ONDANSETRON HCL 4 MG/2ML IJ SOLN: 4.0000 mg | Freq: Three times a day (TID) | INTRAMUSCULAR | Status: DC | PRN

## 2016-01-06 MED ORDER — NALBUPHINE HCL 10 MG/ML IJ SOLN: 5.0000 mg | Freq: Once | INTRAMUSCULAR | Status: DC | PRN

## 2016-01-06 MED ORDER — OXYCODONE-ACETAMINOPHEN 5-325 MG PO TABS: 2.0000 | ORAL_TABLET | ORAL | Status: DC | PRN

## 2016-01-06 MED ORDER — HYDRALAZINE HCL 20 MG/ML IJ SOLN: 10.0000 mg | Freq: Once | INTRAMUSCULAR | Status: DC | PRN

## 2016-01-06 MED ORDER — LACTATED RINGERS IV SOLN
INTRAVENOUS | Status: DC
  Administered 2016-01-06: 05:00:00 via INTRAVENOUS

## 2016-01-06 MED ORDER — WITCH HAZEL-GLYCERIN EX PADS: 1.0000 "application " | MEDICATED_PAD | CUTANEOUS | Status: DC | PRN

## 2016-01-06 MED ORDER — DIPHENHYDRAMINE HCL 25 MG PO CAPS
25.0000 mg | ORAL_CAPSULE | Freq: Four times a day (QID) | ORAL | Status: DC | PRN
  Filled 2016-01-06: qty 1

## 2016-01-06 MED ORDER — OXYCODONE-ACETAMINOPHEN 5-325 MG PO TABS
1.0000 | ORAL_TABLET | ORAL | Status: DC | PRN
  Administered 2016-01-06 – 2016-01-07 (×3): 1 via ORAL
  Filled 2016-01-06 (×3): qty 1

## 2016-01-06 MED ORDER — SIMETHICONE 80 MG PO CHEW
80.0000 mg | CHEWABLE_TABLET | ORAL | Status: DC
  Filled 2016-01-06: qty 1

## 2016-01-06 MED ORDER — MAGNESIUM SULFATE BOLUS VIA INFUSION
4.0000 g | Freq: Once | INTRAVENOUS | Status: AC
  Administered 2016-01-06: 4 g via INTRAVENOUS
  Filled 2016-01-06: qty 500

## 2016-01-06 MED ORDER — ACETAMINOPHEN 500 MG PO TABS
1000.0000 mg | ORAL_TABLET | Freq: Four times a day (QID) | ORAL | Status: AC
  Administered 2016-01-06 (×4): 1000 mg via ORAL
  Filled 2016-01-06 (×4): qty 2

## 2016-01-06 MED ORDER — MEPERIDINE HCL 25 MG/ML IJ SOLN: 6.2500 mg | INTRAMUSCULAR | Status: DC | PRN

## 2016-01-06 MED ORDER — SIMETHICONE 80 MG PO CHEW: 80.0000 mg | CHEWABLE_TABLET | ORAL | Status: DC | PRN

## 2016-01-06 MED ORDER — MENTHOL 3 MG MT LOZG: 1.0000 | LOZENGE | OROMUCOSAL | Status: DC | PRN

## 2016-01-06 MED ORDER — DIPHENHYDRAMINE HCL 50 MG/ML IJ SOLN: 12.5000 mg | INTRAMUSCULAR | Status: DC | PRN

## 2016-01-06 MED ORDER — CAFFEINE CITRATE NICU IV 10 MG/ML (BASE): 20.0000 mg/kg | Freq: Once | INTRAVENOUS | Status: DC

## 2016-01-06 MED ORDER — NALOXONE HCL 2 MG/2ML IJ SOSY
1.0000 ug/kg/h | PREFILLED_SYRINGE | INTRAVENOUS | Status: DC | PRN
  Filled 2016-01-06: qty 2

## 2016-01-06 MED ORDER — DIPHENHYDRAMINE HCL 25 MG PO CAPS
25.0000 mg | ORAL_CAPSULE | ORAL | Status: DC | PRN
  Administered 2016-01-06: 25 mg via ORAL
  Filled 2016-01-06: qty 1

## 2016-01-06 MED ORDER — ZOLPIDEM TARTRATE 5 MG PO TABS: 5.0000 mg | ORAL_TABLET | Freq: Every evening | ORAL | Status: DC | PRN

## 2016-01-06 MED ORDER — COCONUT OIL OIL: 1.0000 "application " | TOPICAL_OIL | Status: DC | PRN

## 2016-01-06 MED ORDER — SENNOSIDES-DOCUSATE SODIUM 8.6-50 MG PO TABS
2.0000 | ORAL_TABLET | ORAL | Status: DC
  Administered 2016-01-06: 2 via ORAL
  Filled 2016-01-06: qty 2

## 2016-01-06 MED ORDER — FENTANYL CITRATE (PF) 100 MCG/2ML IJ SOLN: 25.0000 ug | INTRAMUSCULAR | Status: DC | PRN

## 2016-01-06 MED ORDER — NALBUPHINE HCL 10 MG/ML IJ SOLN: 5.0000 mg | INTRAMUSCULAR | Status: DC | PRN

## 2016-01-06 MED ORDER — PROMETHAZINE HCL 25 MG/ML IJ SOLN: 6.2500 mg | INTRAMUSCULAR | Status: DC | PRN

## 2016-01-06 MED ORDER — PHENYLEPHRINE 40 MCG/ML (10ML) SYRINGE FOR IV PUSH (FOR BLOOD PRESSURE SUPPORT)
PREFILLED_SYRINGE | INTRAVENOUS | Status: AC
  Filled 2016-01-06: qty 10

## 2016-01-06 MED ORDER — IBUPROFEN 600 MG PO TABS
600.0000 mg | ORAL_TABLET | Freq: Four times a day (QID) | ORAL | Status: DC
  Administered 2016-01-06 – 2016-01-07 (×5): 600 mg via ORAL
  Filled 2016-01-06 (×6): qty 1

## 2016-01-06 MED ORDER — OXYTOCIN 40 UNITS IN LACTATED RINGERS INFUSION - SIMPLE MED: 2.5000 [IU]/h | INTRAVENOUS | Status: AC

## 2016-01-06 MED ORDER — MAGNESIUM SULFATE 50 % IJ SOLN
1.0000 g/h | INTRAVENOUS | Status: DC
  Filled 2016-01-06: qty 80

## 2016-01-06 MED ORDER — LABETALOL HCL 5 MG/ML IV SOLN: 20.0000 mg | INTRAVENOUS | Status: DC | PRN

## 2016-01-06 MED ORDER — NALOXONE HCL 0.4 MG/ML IJ SOLN: 0.4000 mg | INTRAMUSCULAR | Status: DC | PRN

## 2016-01-06 MED ORDER — PRENATAL MULTIVITAMIN CH
1.0000 | ORAL_TABLET | Freq: Every day | ORAL | Status: DC
  Administered 2016-01-06 – 2016-01-07 (×2): 1 via ORAL
  Filled 2016-01-06 (×2): qty 1

## 2016-01-06 MED ORDER — DIBUCAINE 1 % RE OINT: 1.0000 "application " | TOPICAL_OINTMENT | RECTAL | Status: DC | PRN

## 2016-01-06 MED ORDER — ACETAMINOPHEN 325 MG PO TABS: 650.0000 mg | ORAL_TABLET | ORAL | Status: DC | PRN

## 2016-01-06 MED ORDER — SIMETHICONE 80 MG PO CHEW
80.0000 mg | CHEWABLE_TABLET | Freq: Three times a day (TID) | ORAL | Status: DC
  Administered 2016-01-06 (×3): 80 mg via ORAL
  Filled 2016-01-06 (×4): qty 1

## 2016-01-06 NOTE — Transfer of Care (Signed)
Immediate Anesthesia Transfer of Care Note  Patient: Kayla GrillsStephanie L Bryant  Procedure(s) Performed: Procedure(s): CESAREAN SECTION (N/A)  Patient Location: PACU  Anesthesia Type:Spinal  Level of Consciousness: awake, alert  and oriented  Airway & Oxygen Therapy: Patient Spontanous Breathing  Post-op Assessment: Report given to RN and Post -op Vital signs reviewed and stable  Post vital signs: Reviewed and stable  Last Vitals:  Vitals:   01/05/16 1948 01/05/16 2003  BP: (!) 156/102 (!) 152/95  Pulse: 72 71  Resp:  18  Temp:  36.7 C    Last Pain:  Vitals:   01/05/16 2030  TempSrc:   PainSc: Asleep      Patients Stated Pain Goal: 0 (01/03/16 2344)  Complications: No apparent anesthesia complications

## 2016-01-06 NOTE — Anesthesia Postprocedure Evaluation (Signed)
Anesthesia Post Note  Patient: Kayla Bryant  Procedure(s) Performed: Procedure(s) (LRB): CESAREAN SECTION (N/A)  Patient location during evaluation: [facility] Anesthesia Type: Spinal Level of consciousness: oriented and awake and alert Pain management: pain level controlled Vital Signs Assessment: post-procedure vital signs reviewed and stable Respiratory status: spontaneous breathing and respiratory function stable Cardiovascular status: blood pressure returned to baseline and stable Postop Assessment: no headache and no backache Anesthetic complications: no     Last Vitals:  Vitals:   01/06/16 0630 01/06/16 0720  BP: 123/85 138/83  Pulse: 68 68  Resp: 18   Temp: 36.7 C 36.7 C    Last Pain:  Vitals:   01/06/16 0730  TempSrc:   PainSc: 0-No pain   Pain Goal: Patients Stated Pain Goal: 4 (01/06/16 0530)               Junious SilkGILBERT,Devri Kreher

## 2016-01-06 NOTE — Progress Notes (Signed)
Patient is doing well.  She is tolerating PO, ambulating.  Pain is controlled.  Lochia is appropriate.  Baby doing well in NICU Denies HA/BV/RUQ pain/SOB/CP BPs have improved since delivery--130/80s.  Uop has increased to 100 cc/ hr.    Vitals:   01/06/16 0818 01/06/16 0926 01/06/16 1019 01/06/16 1109  BP: 134/82 (!) 131/91    Pulse: 63 63    Resp: _0 Temp: 97.8 F (36.6 C) 98 F (36.7 C)    TempSrc: Oral Oral    SpO2: 94% 93%  94%  Weight:      Height:        NAD Abdomen:  soft, appropriate tenderness, incisions intact and without erythema or drainage ext:    Symmetric, 2+ edema bilaterally  Lab Results  Component Value Date   WBC 24.6 (H) 01/06/2016   HGB 14.1 01/06/2016   HCT 41.0 01/06/2016   MCV 91.1 01/06/2016   PLT 240 01/06/2016    --/--/B POS (11/25 0114)/RImmune  A/P    30 y.o. G1P0101 POD 1 s/p primary c/s at [id number] to PPROM / preeclamsia without severe features and non-reassuring fetal status (BPP 2/10) Preeclampsia--never met criteria for severe preeclampsia either by BP or by symptoms.  She therefore does not require postpartum magnesium.  Will discontinue magnesium at this time.  Given significant peripheral edema, will monitor BPs closely over next 2-3 days as I expect BPs to increase as third spacing resolves Lactation to see this AM to assist w pumping Baby doing well in NICU Routine post op and postpartum care.

## 2016-01-06 NOTE — Op Note (Signed)
Pre-Operative Diagnosis: Postoperative Diagnosis: 1) 32+4 Week intrauterine pregnancy 2) Premature preterm rupture of membranes 3) pre-eclampsia without severe features 4) non-reassuring fetal well-being Procedure: 1) 32+4 Week intrauterine pregnancy 2) Premature preterm rupture of membranes 3) pre-eclampsia without severe features 4) non-reassuring fetal well-being Surgeon: Dr. Waynard ReedsKendra Oswin Griffith Assistant: None Operative Findings: Female infant in the vertex presentation with apgar scores of 2 at 1 minute, 7 at 5 minutes, and 8 at 10 minutes. Cord gas was collected, however the lab deemed the sample insufficient for results. Normal-appearing ovaries, tubes, uterus, and appendix. Meconium was noted at the time of delivery Specimen: Placenta to pathology EBL: Total I/O In: 1200 [I.V.:1200] Out: 800 [Urine:300; Blood:500]   Procedure:[name] is an 30 year old gravida 4 para 0030 at 3732 weeks and 4 days estimated gestational age who presents for cesarean section. The patient had been admitted for premature preterm rupture of membranes at 32+3 weeks.  Additionally, her pregnancy has been complicated by preeclampsia without severe features. She had received magnesium sulfate for neuroprotection. Her magnesium had been discontinued, however the the fetal tracing had minimal variability. She was sent for biophysical profile which returned 2 out of 8. Her labs returned as having a magnesium of 4.6 with a creatinine of 0.97. It was felt that the depression of the fetus may have resulted from delayed clearance of magnesium sulfate from the patient's system. The patient was monitored continuously. There were times of improved variability, however the tracing never regained reactivity. A biophysical profile was repeated again 4 hours later and remained 2 out of 8. Therefore the decision was made to proceed with delivery via cesarean.  Following the appropriate informed consent the patient was brought to the operating room  where spinal anesthesia was administered and found to be adequate. She was placed in the dorsal supine position with a leftward tilt. She was prepped and draped in the normal sterile fashion. Scalpel was then used to make a Pfannenstiel skin incision which was carried down to the underlying layers of soft tissue to the fascia. The fascia was incised in the midline and the fascial incision was extended laterally with Mayo scissors. The superior aspect of the fascial incision was grasped with Coker clamps x2, tented up and the rectus muscles dissected off sharply with the electrocautery unit area and the same procedure was repeated on the inferior aspect of the fascial incision. The rectus muscles were separated in the midline. The abdominal peritoneum was identified, tented up, entered sharply, and the incision was extended superiorly and inferiorly with good visualization of the bladder. The Alexis retractor was then deployed. The vesicouterine peritoneum was identified, tented up, entered sharply, and the bladder flap was created digitally. Scalpel was then used to make a low transverse incision on the uterus which was extended laterally with both blunt dissection and the bandage scissors. The fetal vertex was identified, delivered easily through the uterine incision followed by the body. The cord was clamped and cut and the infant was passed to the waiting NICU team. A cord gas was collected and cord blood was collected. The placenta was then manually delivered, and the uterus was cleared of all clot and debris. The uterine incision was closed with #1 chromic in a running locked fashion followed by a second imbricating layer. The ovaries and tubes were inspected and found to be normal. The appendix was incidentally noted to be normal in appearance. The abdominal peritoneum was reapproximated with 2-0 Vicryl in a running fashion. The rectus muscles were reapproximated  with 2-0 chromic in a running fashion. The  fascia was closed with a looped PDS in a running fashion. The subcutaneous tissue was reapproximated with 2-0 plain gut with interrupted sutures. The skin was closed with 4-0 Vicryl in a subcuticular fashion and Dermabond was applied. This completed the operative procedure. The patient tolerated the procedure well and was transferred to the PACU in stable condition following the procedure. All sponge, lap, needle counts were correct 3.

## 2016-01-06 NOTE — Lactation Note (Signed)
This note was copied from a baby's chart. Lactation Consultation Note  Patient Name: Kayla Bryant UJWJX'BToday's Date: 01/06/2016 Reason for consult: Initial assessment;NICU baby Breastfeeding consultation services and support information given and reviewed.  Providing Breastmilk For Your Baby in NICU also given to patient.  Discussed pumping and bringing milk to volume.  Symphony pump set up and initiated.  Instructed to pump 8-12 times in 24 hours.  Taught hand expression and one drop of colostrum obtained.  Mom plans on calling insurance company for pump.  Discussed 2 week rental.  Encouraged to call with concerns/assist.  Maternal Data Has patient been taught Hand Expression?: Yes Does the patient have breastfeeding experience prior to this delivery?: No  Feeding    LATCH Score/Interventions                      Lactation Tools Discussed/Used WIC Program: No Pump Review: Setup, frequency, and cleaning;Milk Storage Initiated by:: LC Date initiated:: 01/06/16   Consult Status Consult Status: Follow-up Date: 01/07/16 Follow-up type: In-patient    Huston FoleyMOULDEN, Korryn Pancoast S 01/06/2016, 11:21 AM

## 2016-01-06 NOTE — Progress Notes (Signed)
Dr. Chestine Sporelark notified of the patient's pitting edema in bilateral lower extremeties. No new orders at this time.

## 2016-01-06 NOTE — Anesthesia Postprocedure Evaluation (Signed)
Anesthesia Post Note  Patient: Kayla Bryant  Procedure(s) Performed: Procedure(s) (LRB): CESAREAN SECTION (N/A)  Patient location during evaluation: PACU Anesthesia Type: Spinal Level of consciousness: oriented and awake and alert Pain management: pain level controlled Vital Signs Assessment: post-procedure vital signs reviewed and stable Respiratory status: spontaneous breathing, respiratory function stable and patient connected to nasal cannula oxygen Cardiovascular status: blood pressure returned to baseline and stable Postop Assessment: no headache, no backache, patient able to bend at knees, spinal receding and no signs of nausea or vomiting Anesthetic complications: no     Last Vitals:  Vitals:   01/06/16 0200 01/06/16 0233  BP: 137/86 (!) 144/91  Pulse: 69 69  Resp: 19 20  Temp:  36.9 C    Last Pain:  Vitals:   01/06/16 0233  TempSrc: Oral  PainSc: 2    Pain Goal: Patients Stated Pain Goal: 4 (01/06/16 0233)               Cecile HearingStephen Edward Coree Riester

## 2016-01-06 NOTE — Progress Notes (Addendum)
Initial visit with Judeth CornfieldStephanie to introduce spiritual care services and offer support while her baby is in NICU.  We discussed labor and her feelings about her baby's birth and NICU stay.    She is doing well and feeling somewhat uncomfortable today.  She expressed relief that Fredricka BonineConnor is doing so well despite his small size. She shared that he was just over two pounds and not due until January.  She states she feels some relief that he's in the NICU because she feels so out of it and exhausted today.  We discussed her support system and her ability to get to the NICU to visit as well as the support the hospital chaplains can provide. She feels some concern due to driving restrictions, but is hopeful her family will be able to help her out. I passed her concerns along to the hospital LCSW.  Please page as further needs arise.  Maryanna ShapeAmanda M. Carley Hammedavee Lomax, M.Div. [facility] Chaplain Pager [phone] Office [phone]

## 2016-01-06 NOTE — Plan of Care (Signed)
Problem: Education: Goal: Knowledge of condition will improve Outcome: Progressing Oriented to room and routine checks for new C/S patient on magnesium therapy. Review on patient education.  Problem: Life Cycle: Goal: Risk for postpartum hemorrhage will decrease Uterus firm,contracted. Bleeding minimal.  Will continue to monitor.  Problem: Nutritional: Goal: Dietary intake will improve Outcome: Progressing Tolerating clear liquid diet. Goal: Mothers verbalization of comfort with breastfeeding process will improve Outcome: Progressing Lungs clear, unlabored breathing. Incentive spirometry initiated and encouraged to do every hour while awake.  Problem: Role Relationship: Goal: Ability to demonstrate positive interaction with newborn will improve Outcome: Not Applicable Date Met: 67/67/20 Baby in NICU

## 2016-01-06 NOTE — Addendum Note (Signed)
Addendum  created 01/06/16 0740 by Junious SilkMelinda Vonte Rossin, CRNA   Sign clinical note

## 2016-01-07 ENCOUNTER — Ambulatory Visit: Payer: Self-pay

## 2016-01-07 MED ORDER — OXYCODONE-ACETAMINOPHEN 5-325 MG PO TABS: 1.0000 | ORAL_TABLET | ORAL | 0 refills | Status: AC | PRN

## 2016-01-07 NOTE — Progress Notes (Signed)
Discharge teaching complete. Pt understood all instructions and did not have any questions. Pt ambulated out of the hospital and discharged home to family.  

## 2016-01-07 NOTE — Lactation Note (Signed)
This note was copied from a baby's chart. Lactation Consultation Note  Patient Name: Kayla Bryant [id number]'Q Date: 01/07/2016 Reason for consult: Follow-up assessment;NICU baby  Mom given 2-week loaner pump prior to D/C. Mom aware of pumping rooms in the NICU and knows to bring kit.  Maternal Data    Feeding Feeding Type: Donor Breast Milk Length of feed: 30 min  LATCH Score/Interventions                      Lactation Tools Discussed/Used     Consult Status Consult Status: Follow-up Date: 01/08/16 Follow-up type: In-patient    Andres Labrum 01/07/2016, 6:04 PM

## 2016-01-07 NOTE — Progress Notes (Signed)
POD#2 Pt doing well. Having some pain at incision. VS- B/P slightly elevated.Will follow. IMP/ Slightly elevated B/P Plan/ Will hold on HTN meds and follow for now.

## 2016-01-07 NOTE — Lactation Note (Signed)
This note was copied from a baby's chart. Lactation Consultation Note  Patient Name: Boy Georgina QuintStephanie Keady GNFAO'ZToday's Date: 01/07/2016 Reason for consult: Follow-up assessment;NICU baby  NICU baby 7139 hours old. Mom reports that she slept through the night last night and did not pump again until mid-morning. Enc mom to pump every 2-3 hours for a total of at least 8 times/24 hours followed by hand expression. Discussed pumping every 2 hours for 3 times in the morning when she awakens if she skips one pumping session during hours of sleep. Discussed progression of milk coming to volume, and the benefit of using a hospital-grade pump. Mom given paperwork for 2-week rental pump--as mom may D/C tonight, or in the early morning. Mom aware of OP/BFSG and LC phone line assistance after D/C. Enc mom to continue to offer STS and nuzzling at the breast as she and baby able.  Maternal Data    Feeding    LATCH Score/Interventions                      Lactation Tools Discussed/Used     Consult Status Consult Status: Follow-up Date: 01/08/16 Follow-up type: In-patient    Sherlyn HayJennifer D Veda Arrellano 01/07/2016, 2:40 PM

## 2016-01-07 NOTE — Progress Notes (Signed)
Pt states that she would like to go home. No headaches or vision changes. Will start labetalol for discharge and followup in the office in 2 days.

## 2016-01-07 NOTE — Discharge Summary (Signed)
Obstetric Discharge Summary Reason for Admission: PPROM and preeclampsia Prenatal Procedures: NST and ultrasound Intrapartum Procedures: cesarean: low cervical, transverse Postpartum Procedures: none Complications-Operative and Postpartum: none Hemoglobin  Date Value Ref Range Status  01/06/2016 14.1 12.0 - 15.0 g/dL Final   HCT  Date Value Ref Range Status  01/06/2016 41.0 36.0 - 46.0 % Final    Physical Exam:  General: alert and cooperative Lochia: appropriate Uterine Fundus: firm Incision: healing well DVT Evaluation: No evidence of DVT seen on physical exam.  Discharge Diagnoses: Preelampsia and PPROM and non reassuring Fetal tracing  Discharge Information: Date: 01/07/2016 Activity: pelvic rest Diet: routine Medications: PNV, Ibuprofen, Percocet and labetalol Condition: stable Instructions: refer to practice specific booklet Discharge to: home Follow-up Information    Almon HerculesOSS,KENDRA H., MD. Schedule an appointment as soon as possible for a visit in 2 day(s).   Specialty:  Obstetrics and Gynecology Contact information: [address] [place] [address] [phone]           Newborn Data: Live born female  Birth Weight: 2 lb 11.4 oz (1230 g) APGAR: 2, 7  Home with in NICU.  ANDERSON,MARK E 01/07/2016, 4:00 PM

## 2016-01-09 ENCOUNTER — Ambulatory Visit: Payer: Self-pay

## 2016-01-09 NOTE — Lactation Note (Signed)
This note was copied from a baby's chart. Lactation Consultation Note  Patient Name: Kayla Bryant IPNYO'X Date: 01/09/2016   NICU baby 75 hours old. Mom reports that she is having some trouble with her DEBP. Mom did not bring with her to the hospital. Enc mom to bring to the hospital to have it checked and exchange if necessary. Mom did bring her pumping kit, so showed mom the pumping rooms in NICU. Mom reports that her milk is starting to increase today, and she was able to pump 10 ml from one side. Enc mom to continue pumping every 2-3 hours for a total of 8 times/24 hours followed by hand expression.   Maternal Data    Feeding    LATCH Score/Interventions                      Lactation Tools Discussed/Used     Consult Status      Kayla Bryant 01/09/2016, 11:59 AM

## 2016-01-14 ENCOUNTER — Ambulatory Visit: Payer: Self-pay

## 2016-01-14 NOTE — Lactation Note (Signed)
This note was copied from a baby's chart. Lactation Consultation Note  Patient Name: Boy Georgina QuintStephanie Seres ZOXWR'UToday's Date: 01/14/2016 Reason for consult: Follow-up assessment;NICU baby;Infant < 6lbs   Follow up with mom of 9 day of NICU infant. Mom is concerned about milk supply. MOm reports she is pumping every 3-3.5 hours and barely covering bottom of bottles. She has obtained 25 cc once yesterday. She reports she was using a PIS at home, she has since rented a [employer].   Enc mom to pump 8-12 x in 24 hours for 15-20 minutes. Enc mom to try power pumping at night, pumping post holding infant, STS, relaxation with pumping. Discussed Fenugreek or More Milk plus may help increase milk supply. Advised mom to call and speak with OB prior to taking herbal supplements.   Enc mom to call with further questions/concerns. Follow up prn.    Maternal Data    Feeding Feeding Type: Donor Breast Milk Length of feed: 30 min  LATCH Score/Interventions                      Lactation Tools Discussed/Used Pump Review: Setup, frequency, and cleaning;Milk Storage   Consult Status Consult Status: PRN Follow-up type: Call as needed    Ed BlalockSharon S Hice 01/14/2016, 1:10 PM

## 2016-01-14 NOTE — Lactation Note (Signed)
This note was copied from a baby's chart. Lactation Consultation Note  Patient Name: Kayla Bryant KGMWN'UToday's Date: 01/14/2016 Reason for consult: Follow-up assessment;NICU baby;Infant < 6lbs   Handouts on Fenugreek, Moringa, Mother Love More Milk Plus, and Lactation Cookies given and explained. Reiterated to mom to call OB prior to beginning any supplements. Maternal history of PIH.    Maternal Data    Feeding Feeding Type: Donor Breast Milk Length of feed: 30 min  LATCH Score/Interventions                      Lactation Tools Discussed/Used Pump Review: Setup, frequency, and cleaning;Milk Storage   Consult Status Consult Status: PRN Follow-up type: Call as needed    Ed BlalockSharon S Starlynn Klinkner 01/14/2016, 2:02 PM

## 2016-01-20 ENCOUNTER — Ambulatory Visit: Payer: Self-pay

## 2016-01-20 NOTE — Lactation Note (Signed)
This note was copied from a baby's chart. Lactation Consultation Note  Patient Name: Boy Georgina QuintStephanie Seckman ZHYQM'VToday's Date: 01/20/2016 Reason for consult: Follow-up assessment;NICU baby;Infant < 6lbs   Spoke with mother in NICU. Mom reports she is pumping and getting about 1.5-2 oz. She reports she spoke with her OB who did not want her taking Fenugreek due to BP issues. Mom is eating Lactation Cookies from [employer] and notes she has noticed a slight increase. Praised mom for her efforts.    Maternal Data    Feeding Feeding Type: Breast Milk Nipple Type: Slow - flow Length of feed: 30 min  LATCH Score/Interventions                      Lactation Tools Discussed/Used     Consult Status Consult Status: PRN Follow-up type: Call as needed    Ed BlalockSharon S Lashonne Shull 01/20/2016, 11:50 AM

## 2016-01-21 ENCOUNTER — Ambulatory Visit: Payer: Self-pay

## 2016-01-21 NOTE — Lactation Note (Signed)
This note was copied from a baby's chart. Lactation Consultation Note  Patient Name: Kayla Bryant ZOXWR'UToday's Date: 01/21/2016  Follow up visit made.  Mom is not sure if she is using the correct size flanges for pumping.  She is pumping 6-8 times in 24 hours with symphony pump and only obtaining 30 mls total.  Observed mom pump in pumping room with 24 mm flanges and fit seems good.  Baby is now 802 weeks old.  Mom is still being treated for high blood pressure.   Her OB does not want her to try any herbal supplements due to B/P.  Recommended she try to pump more often during the day, drink plenty of fluids and get enough rest.   Maternal Data    Feeding Feeding Type: Breast Milk Length of feed: 30 min  LATCH Score/Interventions                      Lactation Tools Discussed/Used     Consult Status      Huston FoleyMOULDEN, Kavir Savoca S 01/21/2016, 6:45 PM

## 2016-01-24 ENCOUNTER — Ambulatory Visit: Payer: Self-pay

## 2016-01-24 NOTE — Lactation Note (Signed)
This note was copied from a baby's chart. Lactation Consultation Note  Patient Name: Boy Georgina QuintStephanie Pavey UJWJX'BToday's Date: 01/24/2016 Reason for consult: Follow-up assessment;NICU baby Called to NICU to assist with latch.  Baby is in football hold skin to skin.  Baby sleepy but opens wide for latch.  Areolar tissue compressible and tea cup hold needed for latch.  Baby latched and sucks a few times before falling asleep.  Relatched a few times and then fell asleep.  Reassured mom that this is normal feeding behavior for 35.2 gestation.  Mom is going to start taking herbal supplements for milk supply.  Encouraged to continue nuzzling baby at breast prn.  Maternal Data    Feeding Feeding Type: Breast Fed Length of feed: 6 min  LATCH Score/Interventions Latch: Repeated attempts needed to sustain latch, nipple held in mouth throughout feeding, stimulation needed to elicit sucking reflex.  Audible Swallowing: None  Type of Nipple: Everted at rest and after stimulation  Comfort (Breast/Nipple): Soft / non-tender     Hold (Positioning): Assistance needed to correctly position infant at breast and maintain latch. Intervention(s): Breastfeeding basics reviewed;Support Pillows;Position options;Skin to skin  LATCH Score: 6  Lactation Tools Discussed/Used     Consult Status Consult Status: PRN    Huston FoleyMOULDEN, Shi Grose S 01/24/2016, 3:38 PM

## 2016-01-28 ENCOUNTER — Ambulatory Visit: Payer: Self-pay

## 2016-01-28 NOTE — Lactation Note (Addendum)
This note was copied from a baby's chart. Lactation Consultation Note  Patient Name: Kayla Bryant ZOXWR'UToday's Date: 01/28/2016 Reason for consult: Follow-up assessment;NICU baby  Follow-up with mom in NICU for Adjusted Age 31.576 (273 week old) baby.   Mom stated she is pumping every 2-3 hours about 8 times per day but is only expressing 20-25 ml EBM.   She stated her Labetalol was discontinued this past Friday and noticed an improvement in milk expressed (was less than 20 ml).   Mom plans to start taking Fenugreek and has been eating oatmeal and lactation cookies.  LC encouraged mom to keep pumping log.  Increase pumpings to every 2 hrs during the day so that she will get her 8x/day in, and doing good massage with pumpings.   Encouraged mom to follow-up with LC in a few days regarding milk supply and her supply needed to be watched since it is below expected levels at this time frame postpartum.   Mom was currently bottle feeding infant and states that she is currently latching him with every-other feeding.   Encouraged mom to have NICU do a pre and post weight after a feeding so that mom will know how much milk infant is transferring at breast.   Mom has a personal Medela DEBP at home but has been using a 2-week rented symphony.  Mom's 2-week rental is about to end but mom is thinking about re-renting symphony.     Consult Status Consult Status: PRN Follow-up type: Call as needed    [name], [name] 01/28/2016, 2:14 PM

## 2016-01-30 ENCOUNTER — Ambulatory Visit: Payer: Self-pay

## 2016-01-30 NOTE — Lactation Note (Signed)
This note was copied from a baby's chart. Lactation Consultation Note  Patient Name: Kayla Bryant ZOXWR'UToday's Date: 01/30/2016 Reason for consult: Follow-up assessment;NICU baby  NICU baby 603 weeks old. Mom has concerns about low milk volumes. Mom states that she has been pumping routinely and getting an ounce or less at each pumping. Discussed the impact of stress on milk supply, but enc mom to continue pumping and giving the baby what she is able to pump. Discussed the benefits of every drop of milk that mom can produce. Assisted mom to latch baby to right breast in football position. Enc mom to "teacup" her breast and hold until baby pulls the breast deeply into his mouth. Baby able to latch deeply and maintain a deep latch with lips flanged. Baby suckled rhythmically with some swallows noted. Enc mom to nurse as often as she and baby able.   Maternal Data    Feeding Feeding Type: Breast Fed Length of feed: 60 min  LATCH Score/Interventions Latch: Repeated attempts needed to sustain latch, nipple held in mouth throughout feeding, stimulation needed to elicit sucking reflex. Intervention(s): Adjust position;Assist with latch;Breast compression  Audible Swallowing: A few with stimulation Intervention(s): Skin to skin;Hand expression  Type of Nipple: Everted at rest and after stimulation  Comfort (Breast/Nipple): Soft / non-tender     Hold (Positioning): Assistance needed to correctly position infant at breast and maintain latch. Intervention(s): Breastfeeding basics reviewed;Support Pillows;Skin to skin  LATCH Score: 7  Lactation Tools Discussed/Used     Consult Status Consult Status: PRN    Sherlyn HayJennifer D Tea Collums 01/30/2016, 6:14 PM

## 2016-02-07 ENCOUNTER — Ambulatory Visit: Payer: Self-pay

## 2016-02-07 NOTE — Lactation Note (Signed)
This note was copied from a baby's chart. Lactation Consultation Note  Patient Name: Kayla Bryant ZOXWR'UToday's Date: 02/07/2016 Reason for consult: Follow-up assessment  With this mom of a NICU baby, now 604 weeks old. Mom has severe discomfort with pumping, and feels her flanges are too big. Her areolas are getting pulled in the front tubular part . On exam, mom has small nipples, so I gave her a pair of 21 flanges to try at home. Mom has cocont oil to use on her nipples with pumping. Mom knows to ask for lactation for questions/conerns.   Maternal Data    Feeding Feeding Type: Breast Milk Nipple Type: Slow - flow Length of feed: 20 min (10 min nuzzling )  LATCH Score/Interventions                      Lactation Tools Discussed/Used Tools: Flanges Flange Size:  (decreased mom to size 21 flanges)   Consult Status      Alfred LevinsLee, Michael Walrath Anne 02/07/2016, 6:16 PM
